# Patient Record
Sex: Male | Born: 1985 | Race: Black or African American | Hispanic: No | Smoking: Current every day smoker
Health system: Southern US, Community
[De-identification: ages and names within clinical notes are randomized; demographics above are authoritative.]

---

## 2009-10-12 ENCOUNTER — Emergency Department (HOSPITAL_COMMUNITY): Admission: EM | Admit: 2009-10-12 | Discharge: 2009-10-12 | Payer: Self-pay | Admitting: Family Medicine

## 2010-10-28 LAB — RPR: RPR Ser Ql: NONREACTIVE

## 2010-10-28 LAB — POCT RAPID STREP A (OFFICE): Streptococcus, Group A Screen (Direct): NEGATIVE

## 2010-10-28 LAB — GC/CHLAMYDIA PROBE AMP, GENITAL
Chlamydia, DNA Probe: POSITIVE — AB
GC Probe Amp, Genital: POSITIVE — AB

## 2012-12-09 ENCOUNTER — Emergency Department (HOSPITAL_COMMUNITY)
Admission: EM | Admit: 2012-12-09 | Discharge: 2012-12-09 | Disposition: A | Discharge status: Home / Self Care | Payer: Self-pay | Attending: Emergency Medicine | Admitting: Emergency Medicine

## 2012-12-09 ENCOUNTER — Encounter (HOSPITAL_COMMUNITY): Payer: Self-pay | Admitting: Emergency Medicine

## 2012-12-09 DIAGNOSIS — Z79899 Other long term (current) drug therapy: Secondary | ICD-10-CM | POA: Insufficient documentation

## 2012-12-09 DIAGNOSIS — N342 Other urethritis: Secondary | ICD-10-CM | POA: Insufficient documentation

## 2012-12-09 DIAGNOSIS — F17200 Nicotine dependence, unspecified, uncomplicated: Secondary | ICD-10-CM | POA: Insufficient documentation

## 2012-12-09 LAB — URINALYSIS, ROUTINE W REFLEX MICROSCOPIC
Bilirubin Urine: NEGATIVE
Ketones, ur: NEGATIVE mg/dL
Nitrite: NEGATIVE
Protein, ur: NEGATIVE mg/dL
pH: 6 (ref 5.0–8.0)

## 2012-12-09 LAB — URINE MICROSCOPIC-ADD ON

## 2012-12-09 MED ORDER — LIDOCAINE HCL 1 % IJ SOLN
INTRAMUSCULAR | Status: AC
  Administered 2012-12-09: 0.9 mL
  Filled 2012-12-09: qty 20

## 2012-12-09 MED ORDER — AZITHROMYCIN 250 MG PO TABS
1000.0000 mg | ORAL_TABLET | Freq: Every day | ORAL | Status: DC
  Administered 2012-12-09: 1000 mg via ORAL
  Filled 2012-12-09: qty 4

## 2012-12-09 MED ORDER — CEFTRIAXONE SODIUM 250 MG IJ SOLR
250.0000 mg | Freq: Once | INTRAMUSCULAR | Status: AC
Start: 2012-12-09 — End: 2012-12-09
  Administered 2012-12-09: 250 mg via INTRAMUSCULAR
  Filled 2012-12-09: qty 250
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
## 2012-12-09 NOTE — ED Provider Notes (Signed)
Medical screening examination/treatment/procedure(s) were performed by non-physician practitioner and as supervising physician I was immediately available for consultation/collaboration.    Toy Eisemann R Dominique Calvey, MD 12/09/12 2255 

## 2012-12-09 NOTE — ED Notes (Signed)
Pt reports 3/10 penile pain. Pt states he recently used the bathroom and used a dirty toilet and is concerned because he has noticed some pain and tingling in his penis. Pt reports that he experiences discomfort while urinating. Pt denies any penile discharge. Pt states no new sexual partners.

## 2012-12-11 LAB — URINE CULTURE

## 2012-12-12 ENCOUNTER — Telehealth (HOSPITAL_COMMUNITY): Payer: Self-pay | Admitting: Emergency Medicine

## 2012-12-12 NOTE — ED Notes (Signed)
Patient has +Gonorrhea. °

## 2012-12-12 NOTE — ED Notes (Signed)
+  Gonorrhea. Patient treated with Rocephin. [employer] faxed. 

## 2012-12-13 ENCOUNTER — Telehealth (HOSPITAL_COMMUNITY): Payer: Self-pay | Admitting: Emergency Medicine

## 2017-05-18 ENCOUNTER — Emergency Department
Admission: EM | Admit: 2017-05-18 | Discharge: 2017-05-18 | Disposition: A | Discharge status: Home / Self Care | Payer: Self-pay | Attending: Emergency Medicine | Admitting: Emergency Medicine

## 2017-05-18 DIAGNOSIS — Y999 Unspecified external cause status: Secondary | ICD-10-CM | POA: Insufficient documentation

## 2017-05-18 DIAGNOSIS — W5501XA Bitten by cat, initial encounter: Secondary | ICD-10-CM | POA: Insufficient documentation

## 2017-05-18 DIAGNOSIS — Z23 Encounter for immunization: Secondary | ICD-10-CM | POA: Insufficient documentation

## 2017-05-18 DIAGNOSIS — D75A Glucose-6-phosphate dehydrogenase (G6PD) deficiency without anemia: Secondary | ICD-10-CM

## 2017-05-18 DIAGNOSIS — Y929 Unspecified place or not applicable: Secondary | ICD-10-CM | POA: Insufficient documentation

## 2017-05-18 DIAGNOSIS — Y939 Activity, unspecified: Secondary | ICD-10-CM | POA: Insufficient documentation

## 2017-05-18 DIAGNOSIS — F17210 Nicotine dependence, cigarettes, uncomplicated: Secondary | ICD-10-CM | POA: Insufficient documentation

## 2017-05-18 DIAGNOSIS — S61452A Open bite of left hand, initial encounter: Secondary | ICD-10-CM | POA: Insufficient documentation

## 2017-05-18 MED ORDER — AMOXICILLIN-POT CLAVULANATE 500-125 MG PO TABS: 1.0000 | ORAL_TABLET | Freq: Three times a day (TID) | ORAL | 0 refills | Status: DC

## 2017-05-18 MED ORDER — HYDROCODONE-ACETAMINOPHEN 5-325 MG PO TABS
1.0000 | ORAL_TABLET | Freq: Once | ORAL | Status: AC
Start: 2017-05-18 — End: 2017-05-18
  Administered 2017-05-18: 1 via ORAL
  Filled 2017-05-18: qty 1

## 2017-05-18 MED ORDER — AMOXICILLIN-POT CLAVULANATE 875-125 MG PO TABS
1.0000 | ORAL_TABLET | Freq: Once | ORAL | Status: AC
  Administered 2017-05-18: 1 via ORAL
  Filled 2017-05-18: qty 1

## 2017-05-18 MED ORDER — HYDROCODONE-ACETAMINOPHEN 5-325 MG PO TABS: 1.0000 | ORAL_TABLET | Freq: Four times a day (QID) | ORAL | 0 refills | Status: DC | PRN

## 2017-05-18 MED ORDER — TETANUS-DIPHTH-ACELL PERTUSSIS 5-2.5-18.5 LF-MCG/0.5 IM SUSP
0.5000 mL | Freq: Once | INTRAMUSCULAR | Status: AC
  Administered 2017-05-18: 0.5 mL via INTRAMUSCULAR
  Filled 2017-05-18: qty 0.5

## 2017-05-18 MED ORDER — AMOXICILLIN-POT CLAVULANATE 875-125 MG PO TABS: 1.0000 | ORAL_TABLET | Freq: Two times a day (BID) | ORAL | 0 refills | Status: AC

## 2017-05-18 NOTE — Discharge Instructions (Signed)
Begin taking Augmentin this evening and continue for 10 days. Clean the area twice a day with mild soap and water. Follow-up with [facility] clinic acute care or return to the emergency room if any severe worsening of your symptoms. Take pain medication only as directed and do not take while driving.

## 2017-05-18 NOTE — ED Triage Notes (Signed)
Stray cat bite to left hand and wrist area last night, swelling and pain.

## 2017-05-18 NOTE — ED Notes (Signed)
See triage note.  Pt was bit by a stray cat yesterday and states that pain has increased and so has swelling in area.  Pt reports barely being able to move L thumb due to swelling.

## 2017-05-18 NOTE — ED Notes (Signed)
Pt discharged to home.  Family member driving.  Discharge instructions reviewed.  Verbalized understanding.  No questions or concerns at this time.  Teach back verified.  Pt in NAD.  No items left in ED.   

## 2017-05-18 NOTE — ED Provider Notes (Signed)
[facility] Emergency Department Provider Note  ____________________________________________   First MD Initiated Contact with Patient 05/18/17 1325     (approximate)  I have reviewed the triage vital signs and the nursing notes.   HISTORY  Chief Complaint Animal Bite   HPI Carlos Collier is a 31 y.o. male is here with complaint of pain to his left hand. Patient was bitten by a cat that was rescued by another person. He states that during the night it began getting warm and swollen. He is unsure of the last tetanus booster. Patient's friend is in the room and states that they have the cat at her home. She states that the cat did not contact differently and remained in the home during the night. Patient's friend states that the cat actually slept with her last night and she had no problems. Animal control is to be called. patient rates his pain as 10 over 10.   History reviewed. No pertinent past medical history.  Patient Active Problem List   Diagnosis Date Noted  . G6PD deficiency (HCC) 05/18/2017    History reviewed. No pertinent surgical history.  Prior to Admission medications   Medication Sig Start Date End Date Taking? Authorizing Provider  amoxicillin-clavulanate (AUGMENTIN) 875-125 MG tablet Take 1 tablet by mouth 2 (two) times daily. 05/18/17 05/25/17  Tommi Rumps, PA-C  cholecalciferol (VITAMIN D) 1000 UNITS tablet Take 1,000 Units by mouth daily.    [provider]  ferrous sulfate 325 (65 FE) MG tablet Take 325 mg by mouth daily with breakfast.    [provider]  HYDROcodone-acetaminophen (NORCO/VICODIN) 5-325 MG tablet Take 1 tablet by mouth every 6 (six) hours as needed for moderate pain. 05/18/17   Tommi Rumps, PA-C  vitamin C (ASCORBIC ACID) 500 MG tablet Take 500 mg by mouth daily.    [provider]    Allergies Aspirin and Glucose  No family history on file.  Social History Social History    Substance Use Topics  . Smoking status: Current Every Day Smoker    Packs/day: 0.25    Years: 5.00    Types: Cigarettes  . Smokeless tobacco: Never Used  . Alcohol use 3.0 oz/week    5 Cans of beer per week    Review of Systems Constitutional: No fever/chills Cardiovascular: Denies chest pain. Respiratory: Denies shortness of breath. Musculoskeletal: positive left hand pain. Skin: positive puncture wound and erythema. Neurological: Negative for  focal weakness or numbness. ____________________________________________   PHYSICAL EXAM:  VITAL SIGNS: ED Triage Vitals  Enc Vitals Group     BP 05/18/17 1155 138/89     Pulse Rate 05/18/17 1155 90     Resp 05/18/17 1155 18     Temp 05/18/17 1155 99 F (37.2 C)     Temp Source 05/18/17 1155 Oral     SpO2 05/18/17 1155 98 %     Weight 05/18/17 1155 162 lb (73.5 kg)     Height 05/18/17 1155  (1.727 m)     Head Circumference --      Peak Flow --      Pain Score 05/18/17 1154 10     Pain Loc --      Pain Edu? --      Excl. in GC? --    Constitutional: Alert and oriented. Well appearing and in no acute distress. Eyes: Conjunctivae are normal. P Head: Atraumatic. Neck: No stridor.   Cardiovascular: Normal rate, regular rhythm. Grossly normal  heart sounds.  Good peripheral circulation. Respiratory: Normal respiratory effort.  No retractions. Lungs CTAB. Musculoskeletal: on examination of the left hand there is a single puncture wound on the hyper thenar eminence with soft tissue swelling. There is no drainage from the puncture site. Area is erythematous and warm to touch. Patient at this time is able to flex and extend his left thumb. Capillary refill is less than 3 seconds. Neurologic:  Normal speech and language. No gross focal neurologic deficits are appreciated. No gait instability. Skin:  Skin is warm, dry.  As noted above. Psychiatric: Mood and affect are normal. Speech and behavior are  normal.  ____________________________________________   LABS (all labs ordered are listed, but only abnormal results are displayed)  Labs Reviewed - No data to display   PROCEDURES  Procedure(s) performed: None  Procedures  Critical Care performed: No  ____________________________________________   INITIAL IMPRESSION / ASSESSMENT AND PLAN / ED COURSE   Discussed Bite with patient and friend present in the room. Patient was started on Augmentin 875 while in the emergency department.he is also given a tetanus booster. Patient is continue taking Augmentin 875 twice a day for 10 days. Norco as needed for pain. He is to clean the area twice daily with mild soap and water. He is to return to the emergency room if any worsening of his symptoms or inability to flex and extend his thumb. They are aware that animal control needs to evaluate the And they are willing to keep a cat in the home for 10 days. They are reassuring that Looks healthy and has maintained normal behavior since this isolated incident. ____________________________________________   FINAL CLINICAL IMPRESSION(S) / ED DIAGNOSES  Final diagnoses:  Cat bite of left hand, initial encounter      NEW MEDICATIONS STARTED DURING THIS VISIT:  Discharge Medication List as of 05/18/2017  2:23 PM    START taking these medications   Details  amoxicillin-clavulanate (AUGMENTIN) 500-125 MG tablet Take 1 tablet (500 mg total) by mouth 3 (three) times daily., Starting Mon 05/18/2017, Print    HYDROcodone-acetaminophen (NORCO/VICODIN) 5-325 MG tablet Take 1 tablet by mouth every 6 (six) hours as needed for moderate pain., Starting Mon 05/18/2017, Print         Note:  This document was prepared using Dragon voice recognition software and may include unintentional dictation errors.    Tommi Rumps, PA-C 05/18/17 Mable Paris, MD 05/20/17 [phone]

## 2017-05-18 NOTE — ED Notes (Signed)
No animal bite report has been made at this time.

## 2017-09-11 ENCOUNTER — Emergency Department
Admission: EM | Admit: 2017-09-11 | Discharge: 2017-09-11 | Disposition: A | Discharge status: Home / Self Care | Payer: Self-pay | Attending: Emergency Medicine | Admitting: Emergency Medicine

## 2017-09-11 ENCOUNTER — Other Ambulatory Visit: Payer: Self-pay

## 2017-09-11 ENCOUNTER — Encounter: Payer: Self-pay | Admitting: Emergency Medicine

## 2017-09-11 DIAGNOSIS — A084 Viral intestinal infection, unspecified: Secondary | ICD-10-CM | POA: Insufficient documentation

## 2017-09-11 DIAGNOSIS — Z79899 Other long term (current) drug therapy: Secondary | ICD-10-CM | POA: Insufficient documentation

## 2017-09-11 DIAGNOSIS — F17210 Nicotine dependence, cigarettes, uncomplicated: Secondary | ICD-10-CM | POA: Insufficient documentation

## 2017-09-11 MED ORDER — ONDANSETRON HCL 4 MG PO TABS: 4.0000 mg | ORAL_TABLET | Freq: Every day | ORAL | 0 refills | Status: AC | PRN

## 2017-09-11 NOTE — ED Notes (Signed)
See triage note  Presents with just nausea. no vomiting. No pain.  Needing a note to go back to work   States able to keep po fluids down

## 2017-09-11 NOTE — ED Triage Notes (Addendum)
Pt arrived via POV from home with reports of N/V and decreased appetite, fatigue and loss stool.  Sxs started about 2 days.  No vomiting today or diarrhea. Pt states his stools are becoming more food.  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  Pt states he is feeling better. Pt states he is not eating.  Pt is drinking water and tolerating it.  PT denies any pain.

## 2017-09-11 NOTE — ED Provider Notes (Signed)
[facility] Emergency Department Provider Note  ____________________________________________  Time seen: Approximately 3:00 PM  I have reviewed the triage vital signs and the nursing notes.   HISTORY  Chief Complaint Nausea and Work Note    HPI Carlos Collier is a 32 y.o. male that presents emergency department for work note after missing 2 days of work for nausea, vomiting, diarrhea.  Patient has not had any vomiting or diarrhea today.  He states that 2 days ago he was eating at a friend's house and ate something with funny white stuff on it.  Vomiting started shortly after.  He has been able to drink liquids without vomiting today.  He has not checked his temperature but has had chills.  He needs a note for work and would like another 2 days of rest.  No abdominal pain.  No sick contacts.   History reviewed. No pertinent past medical history.  Patient Active Problem List   Diagnosis Date Noted  . G6PD deficiency (HCC) 05/18/2017    History reviewed. No pertinent surgical history.  Prior to Admission medications   Medication Sig Start Date End Date Taking? Authorizing Provider  cholecalciferol (VITAMIN D) 1000 UNITS tablet Take 1,000 Units by mouth daily.    [provider]  ferrous sulfate 325 (65 FE) MG tablet Take 325 mg by mouth daily with breakfast.    [provider]  HYDROcodone-acetaminophen (NORCO/VICODIN) 5-325 MG tablet Take 1 tablet by mouth every 6 (six) hours as needed for moderate pain. 05/18/17   Tommi Rumps, PA-C  ondansetron (ZOFRAN) 4 MG tablet Take 1 tablet (4 mg total) by mouth daily as needed for nausea or vomiting. 09/11/17 09/11/18  Enid Derry, PA-C  vitamin C (ASCORBIC ACID) 500 MG tablet Take 500 mg by mouth daily.    [provider]    Allergies Aspirin; Other; and Shellfish allergy  No family history on file.  Social History Social History   Tobacco Use  . Smoking status: Current Every Day  Smoker    Packs/day: 0.25    Years: 5.00    Pack years: 1.25    Types: Cigarettes  . Smokeless tobacco: Never Used  Substance Use Topics  . Alcohol use: Yes    Alcohol/week: 3.0 oz    Types: 5 Cans of beer per week  . Drug use: Yes    Types: Cocaine     Review of Systems   Cardiovascular: No chest pain. Respiratory: No SOB. Gastrointestinal: No abdominal pain.   Musculoskeletal: Negative for musculoskeletal pain. Skin: Negative for rash, abrasions, lacerations, ecchymosis. Neurological: Negative for headaches   ____________________________________________   PHYSICAL EXAM:  VITAL SIGNS: ED Triage Vitals  Enc Vitals Group     BP 09/11/17 1341 133/90     Pulse Rate 09/11/17 1341 79     Resp 09/11/17 1341 18     Temp 09/11/17 1341 98.9 F (37.2 C)     Temp Source 09/11/17 1341 Oral     SpO2 09/11/17 1341 100 %     Weight 09/11/17 1341 166 lb (75.3 kg)     Height 09/11/17 1341 5\' 8"  (1.727 m)     Head Circumference --      Peak Flow --      Pain Score 09/11/17 1345 0     Pain Loc --      Pain Edu? --      Excl. in GC? --      Constitutional: Alert and oriented. Well appearing  and in no acute distress. Eyes: Conjunctivae are normal. PERRL. EOMI. Head: Atraumatic. ENT:      Ears:      Nose: No congestion/rhinnorhea.      Mouth/Throat: Mucous membranes are moist.  Neck: No stridor.  Cardiovascular: Normal rate, regular rhythm.  Good peripheral circulation. Respiratory: Normal respiratory effort without tachypnea or retractions. Lungs CTAB. Good air entry to the bases with no decreased or absent breath sounds. Gastrointestinal: Bowel sounds 4 quadrants. Soft and nontender to palpation. No guarding or rigidity. No palpable masses. No distention. Musculoskeletal: Full range of motion to all extremities. No gross deformities appreciated. Neurologic:  Normal speech and language. No gross focal neurologic deficits are appreciated.  Skin:  Skin is warm, dry and  intact. No rash noted.   ____________________________________________   LABS (all labs ordered are listed, but only abnormal results are displayed)  Labs Reviewed - No data to display ____________________________________________  EKG   ____________________________________________  RADIOLOGY  No results found.  ____________________________________________    PROCEDURES  Procedure(s) performed:    Procedures    Medications - No data to display   ____________________________________________   INITIAL IMPRESSION / ASSESSMENT AND PLAN / ED COURSE  Pertinent labs & imaging results that were available during my care of the patient were reviewed by me and considered in my medical decision making (see chart for details).  Review of the [place] CSRS was performed in accordance of the [facility] prior to dispensing any controlled drugs.   Patient's diagnosis is consistent with viral gastroenteritis.  Vital signs and exam are reassuring.  Patient is starting to feel better.  Work note was provided.  Patient will be discharged home with prescriptions for Zofran. Patient is to follow up with PCP as directed. Patient is given ED precautions to return to the ED for any worsening or new symptoms.   ____________________________________________  FINAL CLINICAL IMPRESSION(S) / ED DIAGNOSES  Final diagnoses:  Viral gastroenteritis      NEW MEDICATIONS STARTED DURING THIS VISIT:  ED Discharge Orders        Ordered    ondansetron (ZOFRAN) 4 MG tablet  Daily PRN     09/11/17 1506          This chart was dictated using voice recognition software/Dragon. Despite best efforts to proofread, errors can occur which can change the meaning. Any change was purely unintentional.    Enid DerryWagner, Kadi Hession, PA-C 09/11/17 1537    Governor RooksLord, Rebecca, MD 09/12/17 [id number]

## 2019-01-16 ENCOUNTER — Emergency Department
Admission: EM | Admit: 2019-01-16 | Discharge: 2019-01-16 | Disposition: A | Discharge status: Home / Self Care | Payer: Self-pay | Attending: Emergency Medicine | Admitting: Emergency Medicine

## 2019-01-16 ENCOUNTER — Other Ambulatory Visit: Payer: Self-pay

## 2019-01-16 DIAGNOSIS — Z20828 Contact with and (suspected) exposure to other viral communicable diseases: Secondary | ICD-10-CM | POA: Insufficient documentation

## 2019-01-16 DIAGNOSIS — F17210 Nicotine dependence, cigarettes, uncomplicated: Secondary | ICD-10-CM | POA: Insufficient documentation

## 2019-01-16 DIAGNOSIS — K0889 Other specified disorders of teeth and supporting structures: Secondary | ICD-10-CM | POA: Insufficient documentation

## 2019-01-16 DIAGNOSIS — R519 Headache, unspecified: Secondary | ICD-10-CM

## 2019-01-16 DIAGNOSIS — R51 Headache: Secondary | ICD-10-CM | POA: Insufficient documentation

## 2019-01-16 LAB — CBC
HCT: 44.6 % (ref 39.0–52.0)
Hemoglobin: 15.5 g/dL (ref 13.0–17.0)
MCH: 34.4 pg — ABNORMAL HIGH (ref 26.0–34.0)
MCHC: 34.8 g/dL (ref 30.0–36.0)
MCV: 99.1 fL (ref 80.0–100.0)
Platelets: 259 10*3/uL (ref 150–400)
RBC: 4.5 MIL/uL (ref 4.22–5.81)
RDW: 13.4 % (ref 11.5–15.5)
WBC: 6.8 10*3/uL (ref 4.0–10.5)
nRBC: 0 % (ref 0.0–0.2)

## 2019-01-16 LAB — COMPREHENSIVE METABOLIC PANEL
ALT: 32 U/L (ref 0–44)
AST: 33 U/L (ref 15–41)
Albumin: 4.8 g/dL (ref 3.5–5.0)
Alkaline Phosphatase: 73 U/L (ref 38–126)
Anion gap: 13 (ref 5–15)
BUN: 13 mg/dL (ref 6–20)
CO2: 28 mmol/L (ref 22–32)
Calcium: 9.7 mg/dL (ref 8.9–10.3)
Chloride: 100 mmol/L (ref 98–111)
Creatinine, Ser: 0.73 mg/dL (ref 0.61–1.24)
GFR calc Af Amer: >60 mL/min (ref >60)
GFR calc non Af Amer: >60 mL/min (ref >60)
Glucose, Bld: 81 mg/dL (ref 70–99)
Potassium: 3.8 mmol/L (ref 3.5–5.1)
Sodium: 141 mmol/L (ref 135–145)
Total Bilirubin: 1.6 mg/dL — ABNORMAL HIGH (ref 0.3–1.2)
Total Protein: 8.2 g/dL — ABNORMAL HIGH (ref 6.5–8.1)

## 2019-01-16 MED ORDER — AMOXICILLIN 500 MG PO CAPS: 500.0000 mg | ORAL_CAPSULE | Freq: Three times a day (TID) | ORAL | 0 refills | Status: DC

## 2019-01-16 MED ORDER — SODIUM CHLORIDE 0.9 % IV BOLUS: 1000.0000 mL | Freq: Once | INTRAVENOUS | Status: DC

## 2019-01-16 MED ORDER — ACETAMINOPHEN 325 MG PO TABS
650.0000 mg | ORAL_TABLET | Freq: Once | ORAL | Status: AC
  Administered 2019-01-16: 650 mg via ORAL
  Filled 2019-01-16: qty 2

## 2019-01-16 MED ORDER — BUTALBITAL-APAP-CAFFEINE 50-325-40 MG PO TABS: 1.0000 | ORAL_TABLET | Freq: Three times a day (TID) | ORAL | 0 refills | Status: AC | PRN

## 2019-01-16 MED ORDER — KETOROLAC TROMETHAMINE 30 MG/ML IJ SOLN
15.0000 mg | Freq: Once | INTRAMUSCULAR | Status: AC
  Administered 2019-01-16: 15 mg via INTRAVENOUS
  Filled 2019-01-16: qty 1

## 2019-01-16 MED ORDER — BUTALBITAL-APAP-CAFFEINE 50-325-40 MG PO TABS: 1.0000 | ORAL_TABLET | Freq: Three times a day (TID) | ORAL | 0 refills | Status: DC | PRN
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
## 2019-01-16 NOTE — ED Notes (Signed)
Computer not working at time of [employer]. Pt verbalizes discharge instructions and medications.

## 2019-01-16 NOTE — ED Provider Notes (Signed)
-----------------------------------------   9:02 PM on 01/16/2019 -----------------------------------------  Patient's work-up is so far negative.  Corona virus send out has not resulted yet.  Patient will follow-up on this at home.  I discussed isolation/quarantine measures.  I also discussed supportive care.  We will write for an antibiotic to cover for possible dental infection as well as Fioricet for his headache although improved after medications.  Patient agreeable to plan of care.   Harvest Dark, MD 01/16/19 2103
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
## 2019-01-16 NOTE — Discharge Instructions (Signed)
Please begin amoxicillin for dental infection.  You can take Fioricet for headache.  Please be sure to drink plenty of fluids.  Please make an appointment with primary care for reevaluation.  OPTIONS FOR DENTAL FOLLOW UP CARE  [place] Department of Health and Human Services - Local Safety Net Dental Clinics [email or web address]://www.ncdhhs.gov/dph/oralhealth/services/safetynetclinics.htm   [facility] [phone])  [facility] [phone])  [facility] [phone])  [facility] [phone])  [facility] [phone]) This clinic caters to the indigent population and is on a lottery system. Location: [employer] [address], [address], [place] [facility] Hours: Wednesdays from 6pm - 9pm, patients seen by a lottery system. For dates, call or go to [email or web address] Services: Cleanings, fillings and simple extractions. Payment Options: DENTAL WORK IS FREE OF CHARGE. Bring proof of income or support. Best way to get seen: Arrive at 5:15 pm - this is a lottery, NOT first come/first serve, so arriving earlier will not increase your chances of being seen.     [facility] [phone] Select option 1 for emergencies   Location: [facility], [address], [address], [place] [facility] Hours: No walk-ins accepted - call the day before to schedule an appointment. Check in times are 9:30 am and 1:30 pm. Services: Simple extractions, temporary fillings, pulpectomy/pulp debridement, uncomplicated abscess drainage. Payment Options: PAYMENT IS DUE AT THE TIME OF SERVICE.  Fee is usually $100-200, additional surgical procedures (e.g. abscess drainage) may be extra. Cash, checks, Visa/MasterCard accepted.  Can file Medicaid if patient is covered for dental - patient should call case worker to check. No discount for [facility]  patients. Best way to get seen: MUST call the day before and get onto the schedule. Can usually be seen the next 1-2 days. No walk-ins accepted.     [facility] [phone]   Location: [facility], [address], [place] [facility] Hours: M, W, Th, F 8am or 1:30pm, Tues 9a or 1:30 - first come/first served. Services: Simple extractions, temporary fillings, uncomplicated abscess drainage.  You do not need to be an [place] resident. Payment Options: PAYMENT IS DUE AT THE TIME OF SERVICE. Dental insurance, otherwise sliding scale - bring proof of income or support. Depending on income and treatment needed, cost is usually $50-200. Best way to get seen: Arrive early as it is first come/first served.     [facility] [phone]   Location: [address] Clinic Hours: Mon-Thu 8a-5p Services: Most basic dental services including extractions and fillings. Payment Options: PAYMENT IS DUE AT THE TIME OF SERVICE. Sliding scale, up to 50% off - bring proof if income or support. Medicaid with dental option accepted. Best way to get seen: Call to schedule an appointment, can usually be seen within 2 weeks OR they will try to see walk-ins - show up at 8a or 2p (you may have to wait).     [facility] [phone] [place] RESIDENTS ONLY   Location: [facility], [address], [place], [place] [address] Clinic Hours: By appointment only. Monday - Thursday 8am-5pm, Friday 8am-12pm Services: Cleanings, fillings, extractions. Payment Options: PAYMENT IS DUE AT THE TIME OF SERVICE. Cash, Visa or MasterCard. Sliding scale - $30 minimum per service. Best way to get seen: Come in to office, complete packet and make an appointment - need proof of income or support monies for  each household member and proof of [facility] residence. Usually takes about a month to  get in.     [facility] [phone]   Location: [address] [place] Clinic Hours: Walk-in Urgent Care Dental Services are offered Monday-Friday mornings only. The numbers of emergencies accepted daily is limited to the number of providers available. Maximum 15 - Mondays, Wednesdays & Thursdays Maximum 10 - Tuesdays & Fridays Services: You do not need to be a [facility] resident to be seen for a dental emergency. Emergencies are defined as pain, swelling, abnormal bleeding, or dental trauma. Walkins will receive x-rays if needed. NOTE: Dental cleaning is not an emergency. Payment Options: PAYMENT IS DUE AT THE TIME OF SERVICE. Minimum co-pay is $40.00 for uninsured patients. Minimum co-pay is $3.00 for Medicaid with dental coverage. Dental Insurance is accepted and must be presented at time of visit. Medicare does not cover dental. Forms of payment: Cash, credit card, checks. Best way to get seen: If not previously registered with the clinic, walk-in dental registration begins at 7:15 am and is on a first come/first serve basis. If previously registered with the clinic, call to make an appointment.     [facility] [place] ONLY   Location: [address] [place], [place] Clinic Hours: Mon-Thu 10a-2p Services: Extractions only! Payment Options: FREE (donations accepted) - bring proof of income or support Best way to get seen: Call and schedule an appointment OR come at 8am on the 1st Monday of every month (except for holidays) when it is first come/first served.     [facility] [phone]   Location: [place], [place] Clinic Hours: Friday mornings Services, Payment Options, Best way to get seen: Call for info

## 2019-01-18 LAB — NOVEL CORONAVIRUS, NAA (HOSP ORDER, SEND-OUT TO REF LAB; TAT 18-24 HRS): SARS-CoV-2, NAA: NOT DETECTED

## 2019-01-19 ENCOUNTER — Telehealth: Payer: Self-pay | Admitting: Emergency Medicine

## 2019-01-19 NOTE — Telephone Encounter (Addendum)
Called patient to inform of covid 19 test result--negative.  Left message.\    pateint called me back and I gave him result.

## 2019-04-19 ENCOUNTER — Ambulatory Visit: Payer: Self-pay

## 2019-04-19 ENCOUNTER — Encounter: Payer: Self-pay | Admitting: Podiatry

## 2019-05-04 NOTE — Progress Notes (Signed)
This encounter was created in error - please disregard.

## 2019-05-28 ENCOUNTER — Emergency Department
Admission: EM | Admit: 2019-05-28 | Discharge: 2019-05-28 | Disposition: A | Discharge status: Home / Self Care | Payer: Self-pay | Attending: Emergency Medicine | Admitting: Emergency Medicine

## 2019-05-28 ENCOUNTER — Other Ambulatory Visit: Payer: Self-pay

## 2019-05-28 ENCOUNTER — Emergency Department: Payer: Self-pay

## 2019-05-28 DIAGNOSIS — S0990XA Unspecified injury of head, initial encounter: Secondary | ICD-10-CM

## 2019-05-28 DIAGNOSIS — F10920 Alcohol use, unspecified with intoxication, uncomplicated: Secondary | ICD-10-CM | POA: Insufficient documentation

## 2019-05-28 DIAGNOSIS — Y9389 Activity, other specified: Secondary | ICD-10-CM | POA: Insufficient documentation

## 2019-05-28 DIAGNOSIS — S0101XA Laceration without foreign body of scalp, initial encounter: Secondary | ICD-10-CM | POA: Insufficient documentation

## 2019-05-28 DIAGNOSIS — Y998 Other external cause status: Secondary | ICD-10-CM | POA: Insufficient documentation

## 2019-05-28 DIAGNOSIS — Y908 Blood alcohol level of 240 mg/100 ml or more: Secondary | ICD-10-CM | POA: Insufficient documentation

## 2019-05-28 DIAGNOSIS — F1410 Cocaine abuse, uncomplicated: Secondary | ICD-10-CM | POA: Insufficient documentation

## 2019-05-28 DIAGNOSIS — F17210 Nicotine dependence, cigarettes, uncomplicated: Secondary | ICD-10-CM | POA: Insufficient documentation

## 2019-05-28 DIAGNOSIS — Y929 Unspecified place or not applicable: Secondary | ICD-10-CM | POA: Insufficient documentation

## 2019-05-28 DIAGNOSIS — Z79899 Other long term (current) drug therapy: Secondary | ICD-10-CM | POA: Insufficient documentation

## 2019-05-28 DIAGNOSIS — R55 Syncope and collapse: Secondary | ICD-10-CM | POA: Insufficient documentation

## 2019-05-28 LAB — CBC
HCT: 44.6 % (ref 39.0–52.0)
Hemoglobin: 15.4 g/dL (ref 13.0–17.0)
MCH: 34.1 pg — ABNORMAL HIGH (ref 26.0–34.0)
MCHC: 34.5 g/dL (ref 30.0–36.0)
MCV: 98.7 fL (ref 80.0–100.0)
Platelets: 217 10*3/uL (ref 150–400)
RBC: 4.52 MIL/uL (ref 4.22–5.81)
RDW: 11.4 % — ABNORMAL LOW (ref 11.5–15.5)
WBC: 8 10*3/uL (ref 4.0–10.5)
nRBC: 0 % (ref 0.0–0.2)

## 2019-05-28 LAB — COMPREHENSIVE METABOLIC PANEL
ALT: 24 U/L (ref 0–44)
AST: 34 U/L (ref 15–41)
Albumin: 4.6 g/dL (ref 3.5–5.0)
Alkaline Phosphatase: 73 U/L (ref 38–126)
Anion gap: 18 — ABNORMAL HIGH (ref 5–15)
BUN: 12 mg/dL (ref 6–20)
CO2: 18 mmol/L — ABNORMAL LOW (ref 22–32)
Calcium: 8.8 mg/dL — ABNORMAL LOW (ref 8.9–10.3)
Chloride: 101 mmol/L (ref 98–111)
Creatinine, Ser: 0.95 mg/dL (ref 0.61–1.24)
GFR calc Af Amer: >60 mL/min (ref >60)
GFR calc non Af Amer: >60 mL/min (ref >60)
Glucose, Bld: 114 mg/dL — ABNORMAL HIGH (ref 70–99)
Potassium: 3.4 mmol/L — ABNORMAL LOW (ref 3.5–5.1)
Sodium: 137 mmol/L (ref 135–145)
Total Bilirubin: 1.1 mg/dL (ref 0.3–1.2)
Total Protein: 8.4 g/dL — ABNORMAL HIGH (ref 6.5–8.1)

## 2019-05-28 LAB — ETHANOL: Alcohol, Ethyl (B): 242 mg/dL — ABNORMAL HIGH (ref <10)

## 2019-05-28 MED ORDER — ACETAMINOPHEN 500 MG PO TABS
1000.0000 mg | ORAL_TABLET | Freq: Once | ORAL | Status: AC
  Administered 2019-05-28: 1000 mg via ORAL

## 2019-05-28 MED ORDER — ONDANSETRON HCL 4 MG/2ML IJ SOLN
4.0000 mg | Freq: Once | INTRAMUSCULAR | Status: AC
  Administered 2019-05-28: 4 mg via INTRAVENOUS
  Filled 2019-05-28: qty 2

## 2019-05-28 MED ORDER — ACETAMINOPHEN 500 MG PO TABS
ORAL_TABLET | ORAL | Status: AC
  Filled 2019-05-28: qty 2

## 2019-05-28 MED ORDER — SODIUM CHLORIDE 0.9 % IV BOLUS
1000.0000 mL | Freq: Once | INTRAVENOUS | Status: AC
  Administered 2019-05-28: 1000 mL via INTRAVENOUS

## 2019-05-28 MED ORDER — MORPHINE SULFATE (PF) 2 MG/ML IV SOLN
2.0000 mg | Freq: Once | INTRAVENOUS | Status: AC
  Administered 2019-05-28: 2 mg via INTRAVENOUS
  Filled 2019-05-28: qty 1

## 2019-05-28 NOTE — Discharge Instructions (Signed)
1.  Staple removal in 7 to 10 days. 2.  You may take Tylenol as needed for pain. 3.  Return to the ER for worsening symptoms, persistent vomiting, lethargy or other concerns.

## 2019-05-28 NOTE — ED Notes (Signed)
Patient transported to CT 
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
## 2019-05-28 NOTE — ED Notes (Signed)
Reviewed discharge instructions, follow-up care, and laceration care, and staple removal with patient. Patient verbalized understanding of all information reviewed. Patient stable, with no distress noted at this time.
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
## 2019-05-28 NOTE — ED Notes (Signed)
Patient sleeping

## 2019-05-28 NOTE — ED Notes (Signed)
MD, charge RN, Eyvonne Mechanic RN, and this RN at bedside for patient arrival.
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
## 2019-06-12 ENCOUNTER — Encounter: Payer: Self-pay | Admitting: Emergency Medicine

## 2019-06-12 ENCOUNTER — Emergency Department
Admission: EM | Admit: 2019-06-12 | Discharge: 2019-06-12 | Disposition: A | Discharge status: Home / Self Care | Payer: Self-pay | Attending: Student | Admitting: Student

## 2019-06-12 ENCOUNTER — Other Ambulatory Visit: Payer: Self-pay

## 2019-06-12 DIAGNOSIS — Z79899 Other long term (current) drug therapy: Secondary | ICD-10-CM | POA: Insufficient documentation

## 2019-06-12 DIAGNOSIS — Z4802 Encounter for removal of sutures: Secondary | ICD-10-CM | POA: Insufficient documentation

## 2019-06-12 DIAGNOSIS — F17210 Nicotine dependence, cigarettes, uncomplicated: Secondary | ICD-10-CM | POA: Insufficient documentation

## 2019-06-12 NOTE — ED Provider Notes (Signed)
[facility] Emergency Department Provider Note  ____________________________________________  Time seen: Approximately 2:14 PM  I have reviewed the triage vital signs and the nursing notes.   HISTORY  Chief Complaint Suture / Staple Removal    HPI Carlos Collier is a 33 y.o. male that presents to the  emergency department for staple removal.  Staples have been in place for 15 days.  No concerns.   History reviewed. No pertinent past medical history.  Patient Active Problem List   Diagnosis Date Noted  . G6PD deficiency 05/18/2017    History reviewed. No pertinent surgical history.  Prior to Admission medications   Medication Sig Start Date End Date Taking? Authorizing Provider  amoxicillin (AMOXIL) 500 MG capsule Take 1 capsule (500 mg total) by mouth 3 (three) times daily. 01/16/19   Minna Antis, MD  butalbital-acetaminophen-caffeine (FIORICET) [id number] MG tablet Take 1-2 tablets by mouth every 8 (eight) hours as needed for headache. 01/16/19 01/16/20  Minna Antis, MD  cholecalciferol (VITAMIN D) 1000 UNITS tablet Take 1,000 Units by mouth daily.    [provider]  ferrous sulfate 325 (65 FE) MG tablet Take 325 mg by mouth daily with breakfast.    [provider]  HYDROcodone-acetaminophen (NORCO/VICODIN) 5-325 MG tablet Take 1 tablet by mouth every 6 (six) hours as needed for moderate pain. 05/18/17   Tommi Rumps, PA-C  vitamin C (ASCORBIC ACID) 500 MG tablet Take 500 mg by mouth daily.    [provider]    Allergies Aspirin, Other, and Shellfish allergy  No family history on file.  Social History Social History   Tobacco Use  . Smoking status: Current Every Day Smoker    Packs/day: 0.25    Years: 5.00    Pack years: 1.25    Types: Cigarettes  . Smokeless tobacco: Never Used  Substance Use Topics  . Alcohol use: Yes    Alcohol/week: 5.0 standard drinks    Types: 5 Cans of beer per week  .  Drug use: Yes    Types: Cocaine     Review of Systems  Constitutional: No fever/chills Musculoskeletal: Negative for musculoskeletal pain. Skin: Negative for rash, ecchymosis.  Positive for stapled laceration. Neurological: Negative for headaches   ____________________________________________   PHYSICAL EXAM:  VITAL SIGNS: ED Triage Vitals  Enc Vitals Group     BP 06/12/19 1358 131/87     Pulse Rate 06/12/19 1357 62     Resp 06/12/19 1357 16     Temp 06/12/19 1357 99.1 F (37.3 C)     Temp Source 06/12/19 1357 Oral     SpO2 06/12/19 1357 97 %     Weight 06/12/19 1358 165 lb 5.5 oz (75 kg)     Height --      Head Circumference --      Peak Flow --      Pain Score 06/12/19 1357 0     Pain Loc --      Pain Edu? --      Excl. in [place]? --      Constitutional: Alert and oriented. Well appearing and in no acute distress. Eyes: Conjunctivae are normal. PERRL. EOMI. Head: Stapled laceration to posterior scalp with large overlying scab. ENT:      Ears:      Nose: No congestion/rhinnorhea.      Mouth/Throat: Mucous membranes are moist.  Neck: No stridor.  Cardiovascular: Normal rate, regular rhythm.  Good peripheral circulation. Respiratory: Normal respiratory effort without tachypnea  or retractions. Lungs CTAB. Good air entry to the bases with no decreased or absent breath sounds. Musculoskeletal: Full range of motion to all extremities. No gross deformities appreciated. Neurologic:  Normal speech and language. No gross focal neurologic deficits are appreciated.  Skin:  Skin is warm, dry and intact. No rash noted. Psychiatric: Mood and affect are normal. Speech and behavior are normal. Patient exhibits appropriate insight and judgement.   ____________________________________________   LABS (all labs ordered are listed, but only abnormal results are displayed)  Labs Reviewed - No data to  display ____________________________________________  EKG   ____________________________________________  RADIOLOGY   No results found.  ____________________________________________    PROCEDURES  Procedure(s) performed:    Procedures  SUTURE REMOVAL Performed by: Laban Emperor  Consent: Verbal consent obtained. Patient identity confirmed: provided demographic data Time out: Immediately prior to procedure a "time out" was called to verify the correct patient, procedure, equipment, support staff and site/side marked as required.  Location details: scalp  Wound Appearance: clean  Sutures/Staples Removed: 10  Facility: sutures placed in this facility Patient tolerance: Patient tolerated the procedure well with no immediate complications.    Medications - No data to display   ____________________________________________   INITIAL IMPRESSION / ASSESSMENT AND PLAN / ED COURSE  Pertinent labs & imaging results that were available during my care of the patient were reviewed by me and considered in my medical decision making (see chart for details).  Review of the [place] CSRS was performed in accordance of the [place] prior to dispensing any controlled drugs.     Patient presented to emergency department for staple removal.  Vital signs and exam are reassuring.  10 staples were removed in the emergency department.  Per previous ER note, 11 staples were placed.  I am unable to visualize 11 staples.  Patient will return to emergency department if he does visualize another staple after scab falls off.  Patient recalls being told there were only 10 staples.  Patient is to follow up with PCP as directed. Patient is given ED precautions to return to the ED for any worsening or new symptoms.   Carlos Collier was evaluated in Emergency Department on 06/12/2019 for the symptoms described in the history of present illness. He was evaluated in the context of the global COVID-19  pandemic, which necessitated consideration that the patient might be at risk for infection with the SARS-CoV-2 virus that causes COVID-19. Institutional protocols and algorithms that pertain to the evaluation of patients at risk for COVID-19 are in a state of rapid change based on information released by regulatory bodies including the CDC and federal and state organizations. These policies and algorithms were followed during the patient's care in the ED.  ____________________________________________  FINAL CLINICAL IMPRESSION(S) / ED DIAGNOSES  Final diagnoses:  Removal of staples      NEW MEDICATIONS STARTED DURING THIS VISIT:  ED Discharge Orders    None          This chart was dictated using voice recognition software/Dragon. Despite best efforts to proofread, errors can occur which can change the meaning. Any change was purely unintentional.    Laban Emperor, PA-C 06/12/19 1721    Lilia Pro., MD 06/13/19 [phone]

## 2019-06-12 NOTE — ED Notes (Signed)
See triage note for assessment  Pt here for suture/staple removal

## 2019-06-12 NOTE — ED Triage Notes (Signed)
Pt to ED via POV for suture and staple removal. Pt is in NAD and denies any complaints at this time.

## 2019-12-16 IMAGING — CT CT HEAD W/O CM
4 series · 16 of 47 positions shown, 18 images · non-contrast
Comparison: None.

CLINICAL DATA: 33-year-old male with head injury.

EXAM:
CT HEAD WITHOUT CONTRAST
CT CERVICAL SPINE WITHOUT CONTRAST
TECHNIQUE: Multidetector CT imaging of the head and cervical spine was
performed following the standard protocol without intravenous
contrast. Multiplanar CT image reconstructions of the cervical spine
were also generated.

[Series 2: head wo · axial · 0.45mm/px · z∈[-102,+3]mm · 7 of 29 slices shown, 9 images]
[im 4/29  brain]
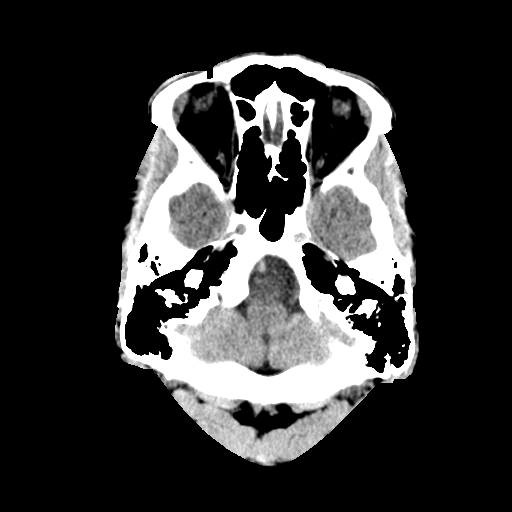
[im 4/29  bone]
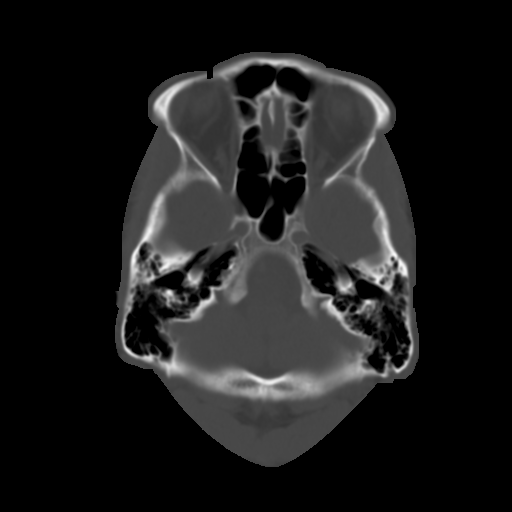
[im 8/29  brain]
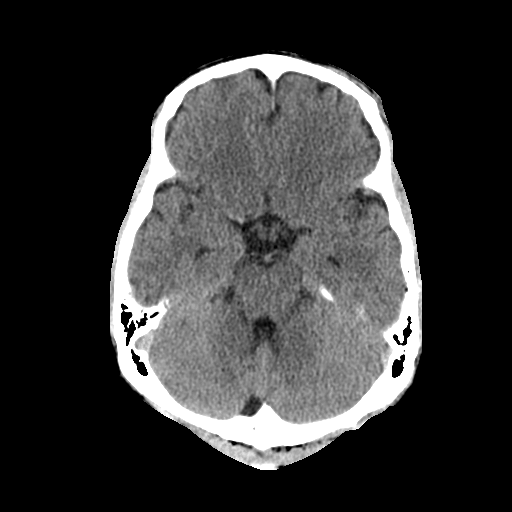
[im 11/29  brain]
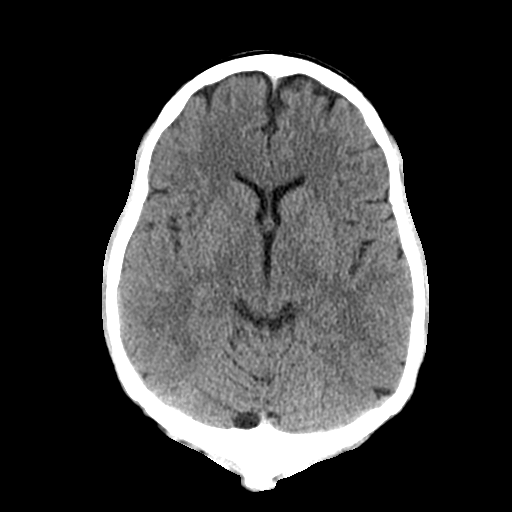
[im 15/29  brain]
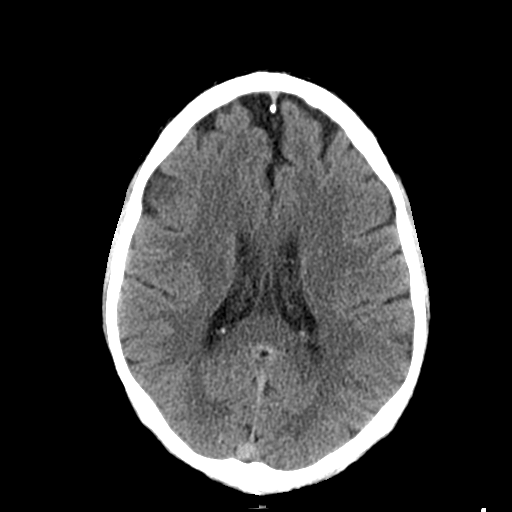
[im 18/29  brain]
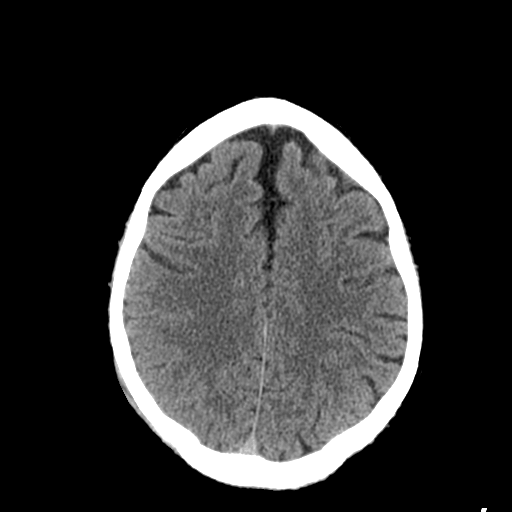
[im 18/29  bone]
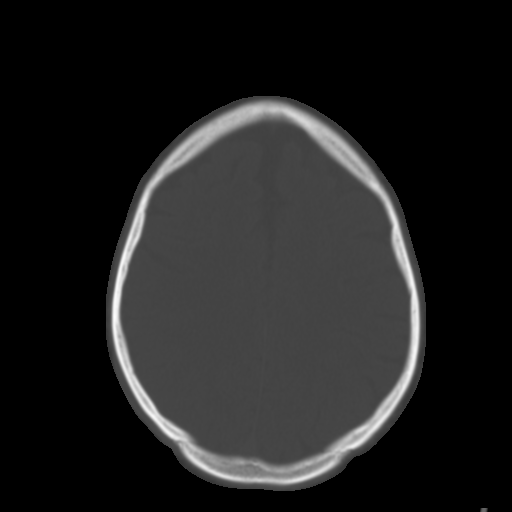
[im 22/29  brain]
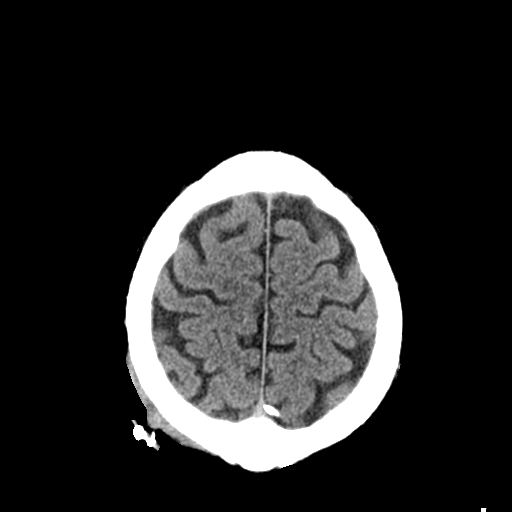
[im 25/29  brain]
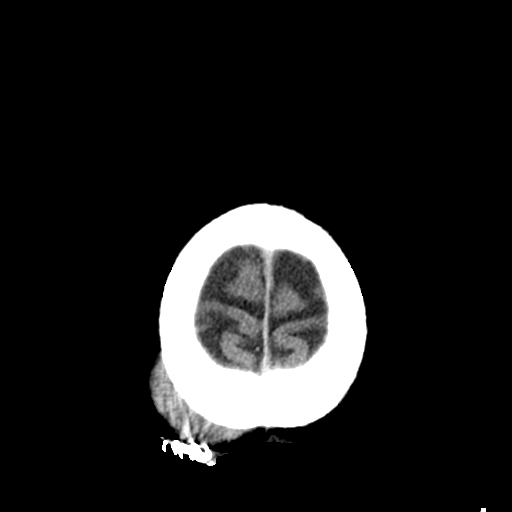

[Series 3: head bone · axial · 0.45mm/px · z∈[-104,-76]mm · 3 of 73 slices shown]
[im 8/73  bone]
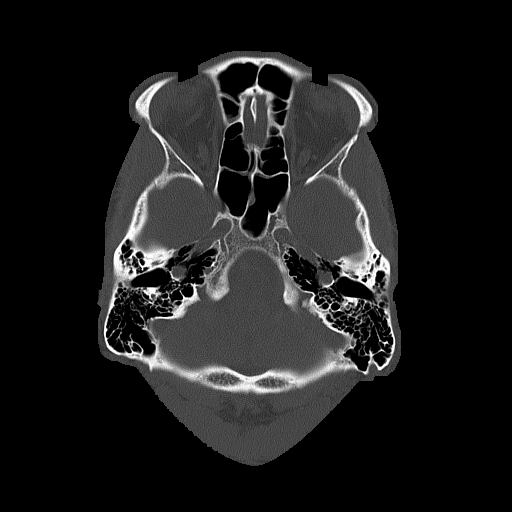
[im 15/73  bone]
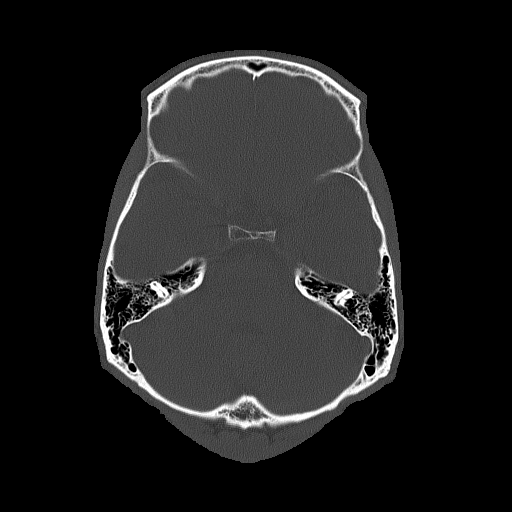
[im 22/73  bone]
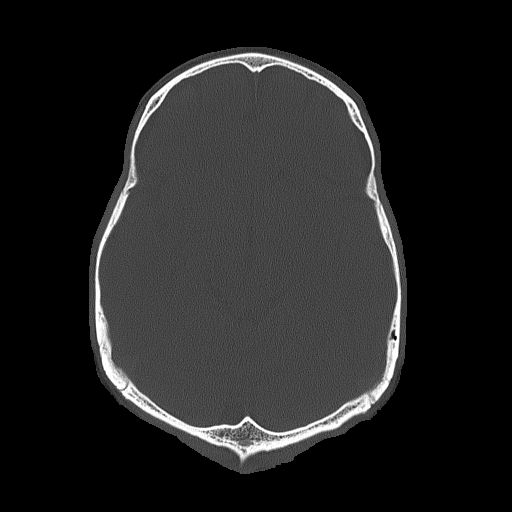

[Series 4: coronal soft tissue · coronal · 0.32mm/px · 3 of 66 slices shown]
[im 22/66  brain]
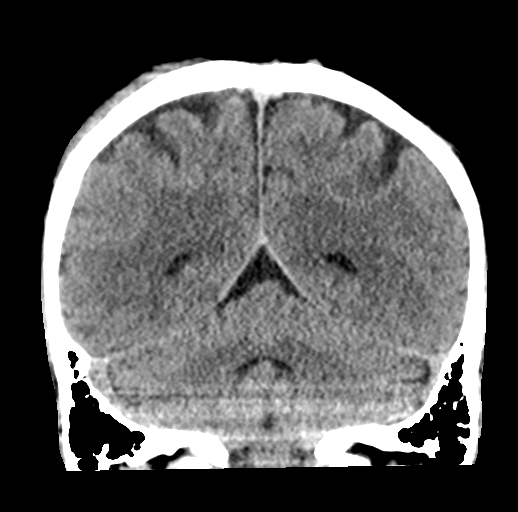
[im 29/66  brain]
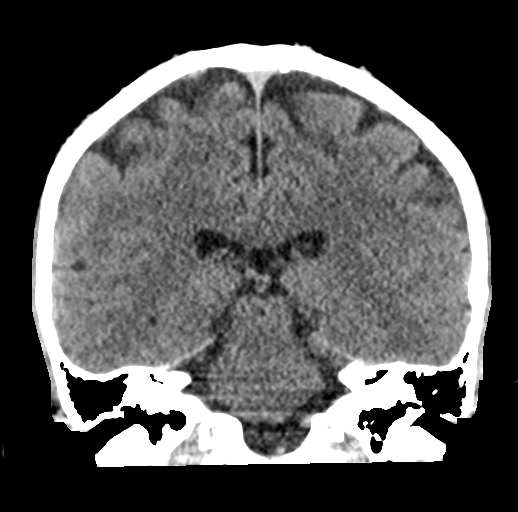
[im 37/66  brain]
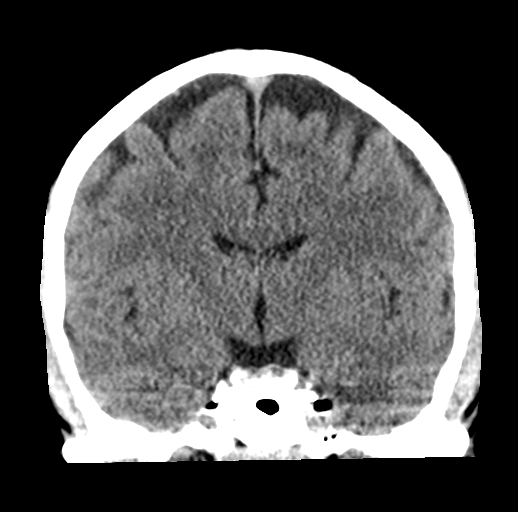

[Series 5: sagittal soft tissue · sagittal · 0.32mm/px · 3 of 52 slices shown]
[im 18/52  brain]
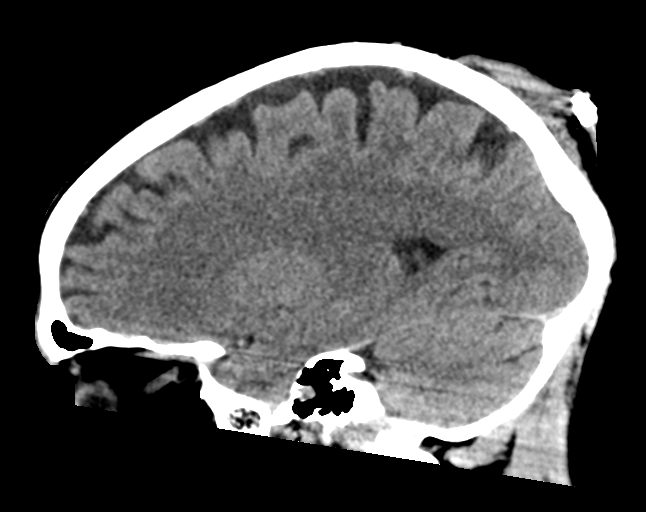
[im 26/52  brain]
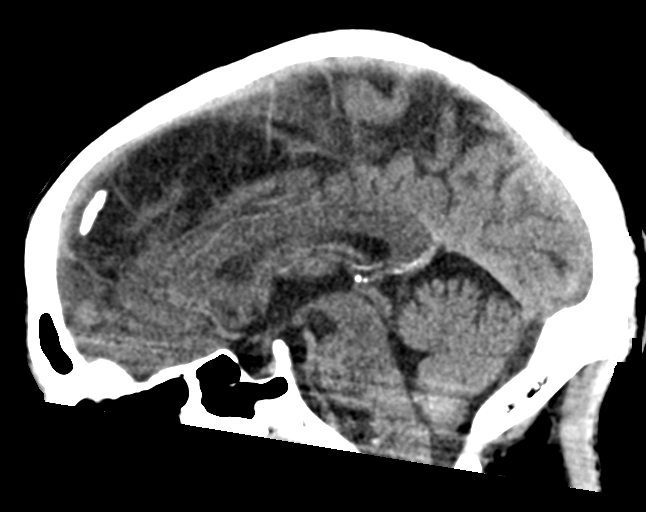
[im 35/52  brain]
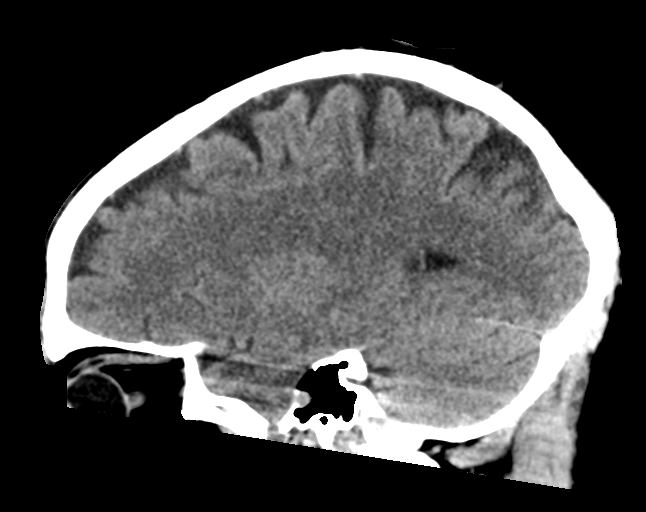

[16 of 47 positions shown; findings below may reference images not displayed]

FINDINGS: CT HEAD FINDINGS

Brain: The ventricles and sulci appropriate size for patient's age.
The gray-white matter discrimination is preserved. There is no acute
intracranial hemorrhage. No mass effect or midline shift. No
extra-axial fluid collection.

Vascular: No hyperdense vessel or unexpected calcification.

Skull: Normal. Negative for fracture or focal lesion.

Sinuses/Orbits: No acute finding.

Other: Right posterior parietal scalp hematoma and skin staples.

CT CERVICAL SPINE FINDINGS

Alignment: No acute subluxation. There is straightening of normal
cervical lordosis which may be positional or due to muscle spasm.

Skull base and vertebrae: No acute fracture. No primary bone lesion
or focal pathologic process.

Soft tissues and spinal canal: No prevertebral fluid or swelling. No
visible canal hematoma.

Disc levels:  No acute findings. No degenerative changes.

Upper chest: Negative.

Other: None
IMPRESSION: 1. No acute intracranial pathology.
2. No acute/traumatic cervical spine pathology.

## 2020-01-10 ENCOUNTER — Emergency Department
Admission: EM | Admit: 2020-01-10 | Discharge: 2020-01-10 | Disposition: A | Discharge status: Home / Self Care | Payer: Self-pay | Attending: Student | Admitting: Student

## 2020-01-10 ENCOUNTER — Other Ambulatory Visit: Payer: Self-pay

## 2020-01-10 DIAGNOSIS — Z79899 Other long term (current) drug therapy: Secondary | ICD-10-CM | POA: Insufficient documentation

## 2020-01-10 DIAGNOSIS — K0889 Other specified disorders of teeth and supporting structures: Secondary | ICD-10-CM | POA: Insufficient documentation

## 2020-01-10 DIAGNOSIS — F17210 Nicotine dependence, cigarettes, uncomplicated: Secondary | ICD-10-CM | POA: Insufficient documentation

## 2020-01-10 MED ORDER — KETOROLAC TROMETHAMINE 30 MG/ML IJ SOLN
30.0000 mg | Freq: Once | INTRAMUSCULAR | Status: AC
  Administered 2020-01-10: 30 mg via INTRAMUSCULAR
  Filled 2020-01-10: qty 1

## 2020-01-10 MED ORDER — LIDOCAINE VISCOUS HCL 2 % MT SOLN: 10.0000 mL | OROMUCOSAL | 0 refills | Status: DC | PRN

## 2020-01-10 MED ORDER — LIDOCAINE VISCOUS HCL 2 % MT SOLN
15.0000 mL | Freq: Once | OROMUCOSAL | Status: AC
  Administered 2020-01-10: 15 mL via OROMUCOSAL
  Filled 2020-01-10: qty 15

## 2020-01-10 MED ORDER — AMOXICILLIN 500 MG PO CAPS
500.0000 mg | ORAL_CAPSULE | Freq: Once | ORAL | Status: AC
  Administered 2020-01-10: 500 mg via ORAL
  Filled 2020-01-10: qty 1

## 2020-01-10 MED ORDER — AMOXICILLIN 500 MG PO CAPS: 500.0000 mg | ORAL_CAPSULE | Freq: Three times a day (TID) | ORAL | 0 refills | Status: DC

## 2020-01-10 MED ORDER — KETOROLAC TROMETHAMINE 10 MG PO TABS: 10.0000 mg | ORAL_TABLET | Freq: Four times a day (QID) | ORAL | 0 refills | Status: DC | PRN

## 2020-01-10 NOTE — Discharge Instructions (Signed)
OPTIONS FOR DENTAL FOLLOW UP CARE ° °[place] Department of Health and Human Services - Local Safety Net Dental Clinics °http://www.ncdhhs.gov/dph/oralhealth/services/safetynetclinics.htm °  °[facility] (336-562-3123) ° °[facility] [place] ([phone]) ° °[facility] ([phone] ext [phone]) ° °[facility] ([phone]) ° °[facility] ([phone]) °This clinic caters to the indigent population and is on a lottery system. °Location: °[facility], [address], [place] °Clinic Hours: °Wednesdays from 6pm - 9pm, patients seen by a lottery system. °For dates, call or go to [email or web address]/[facility]/patients/[email or web address] °Services: °Cleanings, fillings and simple extractions. °Payment Options: °DENTAL WORK IS FREE OF CHARGE. Bring proof of income or support. °Best way to get seen: °Arrive at 5:15 pm - this is a lottery, NOT first come/first serve, so arriving earlier will not increase your chances of being seen. °  °  °[facility] °[phone] °Select option 1 for emergencies °  °Location: °[facility], [address], [place] °Clinic Hours: °No walk-ins accepted - call the day before to schedule an appointment. °Check in times are 9:30 am and 1:30 pm. °Services: °Simple extractions, temporary fillings, pulpectomy/pulp debridement, uncomplicated abscess drainage. °Payment Options: °PAYMENT IS DUE AT THE TIME OF SERVICE.  Fee is usually $100-200, additional surgical procedures (e.g. abscess drainage) may be extra. °Cash, checks, Visa/MasterCard accepted.  Can file Medicaid if patient is covered for dental - patient should call case worker to check. °No discount for [facility] patients. °Best way to get seen: °MUST call the day before and get onto the schedule. Can usually be seen the next 1-2 days. No walk-ins accepted. °  °  °[facility] °[phone] °   °Location: °[facility], [address], [place] °Clinic Hours: °M, W, Th, F 8am or 1:30pm, Tues 9a or 1:30 - first come/first served. °Services: °Simple extractions, temporary fillings, uncomplicated abscess drainage.  You do not need to be an [place] resident. °Payment Options: °PAYMENT IS DUE AT THE TIME OF SERVICE. °Dental insurance, otherwise sliding scale - bring proof of income or support. °Depending on income and treatment needed, cost is usually $50-200. °Best way to get seen: °Arrive early as it is first come/first served. °  °  °[facility] °[phone] °  °Location: °[address] °Clinic Hours: °Mon-Thu 8a-5p °Services: °Most basic dental services including extractions and fillings. °Payment Options: °PAYMENT IS DUE AT THE TIME OF SERVICE. °Sliding scale, up to 50% off - bring proof if income or support. °Medicaid with dental option accepted. °Best way to get seen: °Call to schedule an appointment, can usually be seen within 2 weeks OR they will try to see walk-ins - show up at 8a or 2p (you may have to wait). °  °  °[facility] °[phone] °[place] RESIDENTS ONLY °  °Location: °[facility], [address], [place],  [address] °Clinic Hours: By appointment only. °Monday - Thursday 8am-5pm, Friday 8am-12pm °Services: Cleanings, fillings, extractions. °Payment Options: °PAYMENT IS DUE AT THE TIME OF SERVICE. °Cash, Visa or MasterCard. Sliding scale - $30 minimum per service. °Best way to get seen: °Come in to office, complete packet and make an appointment - need proof of income °or support monies for each household member and proof of [place] residence. °Usually takes about a month to get in. °  °  °[facility] °[phone] °  °Location: °[address]   [place] °Clinic Hours: Walk-in Urgent Care Dental Services are offered Monday-Friday  mornings only. °The numbers of emergencies accepted daily is limited to the number of °providers available. °Maximum 15 - Mondays, Wednesdays & Thursdays °Maximum 10 - Tuesdays & Fridays °Services: °You do not need to be a [place] resident to be seen for a dental emergency. °Emergencies are defined as pain, swelling, abnormal bleeding, or dental trauma. Walkins will receive x-rays if needed. °NOTE: Dental cleaning is not an emergency. °Payment Options: °PAYMENT IS DUE AT THE TIME OF SERVICE. °Minimum co-pay is $40.00 for uninsured patients. °Minimum co-pay is $3.00 for Medicaid with dental coverage. °Dental Insurance is accepted and must be presented at time of visit. °Medicare does not cover dental. °Forms of payment: Cash, credit card, checks. °Best way to get seen: °If not previously registered with the clinic, walk-in dental registration begins at 7:15 am and is on a first come/first serve basis. °If previously registered with the clinic, call to make an appointment. °  °  °[facility] °[phone] °[place] RESIDENTS ONLY °  °Location: °[address], [place], [place] °Clinic Hours: °Mon-Thu 10a-2p °Services: Extractions only! °Payment Options: °FREE (donations accepted) - bring proof of income or support °Best way to get seen: °Call and schedule an appointment OR come at 8am on the 1st Monday of every month (except for holidays) when it is first come/first served. °  °  °[facility] °[phone] °  °Location: °[address], [place] °Clinic Hours: °Friday mornings °Services, Payment Options, Best way to get seen: °Call for info °

## 2020-01-10 NOTE — ED Provider Notes (Signed)
[facility] Emergency Department Provider Note  ____________________________________________  Time seen: Approximately 9:40 PM  I have reviewed the triage vital signs and the nursing notes.   HISTORY  Chief Complaint Dental Pain    HPI Carlos Collier is a 34 y.o. male that presents to emergency department for evaluation of right bottom dental pain today.  Patient states that pain is making the whole right side of his face hurt.  It is giving him a right-sided headache.  Patient called his dentist today but they were unable to get him in for an appointment today.  No known fevers but patient has felt warm.   History reviewed. No pertinent past medical history.  Patient Active Problem List   Diagnosis Date Noted  . G6PD deficiency 05/18/2017    History reviewed. No pertinent surgical history.  Prior to Admission medications   Medication Sig Start Date End Date Taking? Authorizing Provider  amoxicillin (AMOXIL) 500 MG capsule Take 1 capsule (500 mg total) by mouth 3 (three) times daily. 01/10/20   Enid Derry, PA-C  butalbital-acetaminophen-caffeine (FIORICET) [phone] MG tablet Take 1-2 tablets by mouth every 8 (eight) hours as needed for headache. 01/16/19 01/16/20  Minna Antis, MD  cholecalciferol (VITAMIN D) 1000 UNITS tablet Take 1,000 Units by mouth daily.    [provider]  ferrous sulfate 325 (65 FE) MG tablet Take 325 mg by mouth daily with breakfast.    [provider]  HYDROcodone-acetaminophen (NORCO/VICODIN) 5-325 MG tablet Take 1 tablet by mouth every 6 (six) hours as needed for moderate pain. 05/18/17   Tommi Rumps, PA-C  ketorolac (TORADOL) 10 MG tablet Take 1 tablet (10 mg total) by mouth every 6 (six) hours as needed. 01/10/20   Enid Derry, PA-C  lidocaine (XYLOCAINE) 2 % solution Use as directed 10 mLs in the mouth or throat as needed. 01/10/20   Enid Derry, PA-C  vitamin C (ASCORBIC ACID) 500 MG tablet Take  500 mg by mouth daily.    [provider]    Allergies Aspirin, Other, and Shellfish allergy  No family history on file.  Social History Social History   Tobacco Use  . Smoking status: Current Every Day Smoker    Packs/day: 0.25    Years: 5.00    Pack years: 1.25    Types: Cigarettes  . Smokeless tobacco: Never Used  Substance Use Topics  . Alcohol use: Yes    Alcohol/week: 5.0 standard drinks    Types: 5 Cans of beer per week  . Drug use: Yes    Types: Cocaine     Review of Systems  Constitutional: No fever Cardiovascular: No chest pain. Respiratory: No SOB. Gastrointestinal: No abdominal pain.  No nausea, no vomiting.  Musculoskeletal: Negative for musculoskeletal pain. Skin: Negative for rash, abrasions, lacerations, ecchymosis. Neurological: Positive for headache.   ____________________________________________   PHYSICAL EXAM:  VITAL SIGNS: ED Triage Vitals  Enc Vitals Group     BP 01/10/20 2010 (!) 154/101     Pulse Rate 01/10/20 2010 (!) 57     Resp 01/10/20 2010 15     Temp 01/10/20 2010 98.4 F (36.9 C)     Temp Source 01/10/20 2010 Oral     SpO2 01/10/20 2010 99 %     Weight 01/10/20 2011 190 lb (86.2 kg)     Height 01/10/20 2011 5\' 8"  (1.727 m)     Head Circumference --      Peak Flow --  Pain Score 01/10/20 2015 10     Pain Loc --      Pain Edu? --      Excl. in [place]? --      Constitutional: Alert and oriented. Well appearing and in no acute distress. Eyes: Conjunctivae are normal. PERRL. EOMI. Head: Atraumatic. ENT:      Ears: Tympanic membranes are pearly.      Nose: No congestion/rhinnorhea.      Mouth/Throat: Mucous membranes are moist.  Large cavity and decay to right bottom molar.  No swelling.  Full range of motion of jaw. Neck: No stridor.   Cardiovascular: Normal rate, regular rhythm.  Good peripheral circulation. Respiratory: Normal respiratory effort without tachypnea or retractions. Lungs CTAB. Good air entry to  the bases with no decreased or absent breath sounds. Musculoskeletal: Full range of motion to all extremities. No gross deformities appreciated. Neurologic:  Normal speech and language. No gross focal neurologic deficits are appreciated.  Skin:  Skin is warm, dry and intact. No rash noted. Psychiatric: Mood and affect are normal. Speech and behavior are normal. Patient exhibits appropriate insight and judgement.   ____________________________________________   LABS (all labs ordered are listed, but only abnormal results are displayed)  Labs Reviewed - No data to display ____________________________________________  EKG   ____________________________________________  RADIOLOGY   No results found.  ____________________________________________    PROCEDURES  Procedure(s) performed:    Procedures    Medications  ketorolac (TORADOL) 30 MG/ML injection 30 mg (30 mg Intramuscular Given 01/10/20 2157)  lidocaine (XYLOCAINE) 2 % viscous mouth solution 15 mL (15 mLs Mouth/Throat Given 01/10/20 2157)  amoxicillin (AMOXIL) capsule 500 mg (500 mg Oral Given 01/10/20 2157)     ____________________________________________   INITIAL IMPRESSION / ASSESSMENT AND PLAN / ED COURSE  Pertinent labs & imaging results that were available during my care of the patient were reviewed by me and considered in my medical decision making (see chart for details).  Review of the [place] CSRS was performed in accordance of the [place] prior to dispensing any controlled drugs.     Patient's diagnosis is consistent with dental infection.  Vital signs and exam are reassuring.  Patient was given IM Toradol for pain.  He was given amoxicillin for possible infection.  Patient will be discharged home with prescriptions for amoxicillin, Toradol, viscous lidocaine. Patient is to follow up with dentist as directed.  Patient has a Pharmacist, community but dental resources were provided.  Patient is given ED precautions to return  to the ED for any worsening or new symptoms.   Carlos Collier was evaluated in Emergency Department on 01/10/2020 for the symptoms described in the history of present illness. He was evaluated in the context of the global COVID-19 pandemic, which necessitated consideration that the patient might be at risk for infection with the SARS-CoV-2 virus that causes COVID-19. Institutional protocols and algorithms that pertain to the evaluation of patients at risk for COVID-19 are in a state of rapid change based on information released by regulatory bodies including the CDC and federal and state organizations. These policies and algorithms were followed during the patient's care in the ED.  ____________________________________________  FINAL CLINICAL IMPRESSION(S) / ED DIAGNOSES  Final diagnoses:  Pain, dental      NEW MEDICATIONS STARTED DURING THIS VISIT:  ED Discharge Orders         Ordered    amoxicillin (AMOXIL) 500 MG capsule  3 times daily,   Status:  Discontinued  01/10/20 2239    lidocaine (XYLOCAINE) 2 % solution  As needed,   Status:  Discontinued     01/10/20 2239    ketorolac (TORADOL) 10 MG tablet  Every 6 hours PRN,   Status:  Discontinued     01/10/20 2239    amoxicillin (AMOXIL) 500 MG capsule  3 times daily     01/10/20 2249    ketorolac (TORADOL) 10 MG tablet  Every 6 hours PRN     01/10/20 2249    lidocaine (XYLOCAINE) 2 % solution  As needed     01/10/20 2249              This chart was dictated using voice recognition software/Dragon. Despite best efforts to proofread, errors can occur which can change the meaning. Any change was purely unintentional.    Enid Derry, PA-C 01/10/20 2251    Miguel Aschoff., MD 01/10/20 [phone]

## 2020-01-10 NOTE — ED Triage Notes (Signed)
Pt arrives to ED via POV from home with c/o lower and upper right-sided dental pain since 7am this AM. Pt reports he called his dentist and was told to come to the ED d/t no appointment available. Pt reports taking OTC Tylenol PTA without relief. Pt reports (+) nausea, denies diarrhea or fever. Pt is A&O, in NAD; RR even, regular, and unlabored.

## 2020-05-10 ENCOUNTER — Other Ambulatory Visit: Payer: Self-pay

## 2020-05-10 ENCOUNTER — Encounter: Payer: Self-pay | Admitting: *Deleted

## 2020-05-10 ENCOUNTER — Emergency Department
Admission: EM | Admit: 2020-05-10 | Discharge: 2020-05-10 | Disposition: A | Discharge status: Home / Self Care | Payer: Self-pay | Attending: Emergency Medicine | Admitting: Emergency Medicine

## 2020-05-10 DIAGNOSIS — K0889 Other specified disorders of teeth and supporting structures: Secondary | ICD-10-CM

## 2020-05-10 DIAGNOSIS — S025XXA Fracture of tooth (traumatic), initial encounter for closed fracture: Secondary | ICD-10-CM

## 2020-05-10 DIAGNOSIS — K029 Dental caries, unspecified: Secondary | ICD-10-CM | POA: Insufficient documentation

## 2020-05-10 DIAGNOSIS — F17210 Nicotine dependence, cigarettes, uncomplicated: Secondary | ICD-10-CM | POA: Insufficient documentation

## 2020-05-10 DIAGNOSIS — K0381 Cracked tooth: Secondary | ICD-10-CM | POA: Insufficient documentation

## 2020-05-10 DIAGNOSIS — K047 Periapical abscess without sinus: Secondary | ICD-10-CM | POA: Insufficient documentation

## 2020-05-10 MED ORDER — AMOXICILLIN-POT CLAVULANATE 875-125 MG PO TABS
1.0000 | ORAL_TABLET | Freq: Once | ORAL | Status: AC
  Administered 2020-05-10: 1 via ORAL
  Filled 2020-05-10: qty 1

## 2020-05-10 MED ORDER — AMOXICILLIN-POT CLAVULANATE 875-125 MG PO TABS: 1.0000 | ORAL_TABLET | Freq: Two times a day (BID) | ORAL | 0 refills | Status: AC

## 2020-05-10 MED ORDER — HYDROCODONE-ACETAMINOPHEN 5-325 MG PO TABS
1.0000 | ORAL_TABLET | ORAL | Status: AC
  Administered 2020-05-10: 1 via ORAL
  Filled 2020-05-10: qty 1

## 2020-05-10 MED ORDER — HYDROCODONE-ACETAMINOPHEN 5-325 MG PO TABS: 1.0000 | ORAL_TABLET | ORAL | 0 refills | Status: DC | PRN

## 2020-05-10 NOTE — Discharge Instructions (Signed)
Please take antibiotics as prescribed.  Take pain medication as prescribed.  You may use Tylenol and ibuprofen as needed for additional pain relief.  Return to the ER for any fevers increased pain or swelling.

## 2020-05-10 NOTE — ED Provider Notes (Signed)
[facility] EMERGENCY DEPARTMENT Provider Note   CSN: [id number] Arrival date & time: 05/10/20  1636     History Chief Complaint  Patient presents with  . Facial Pain  . Dental Pain    Carlos Collier is a 34 y.o. male presents to the emergency department for evaluation of lower tooth pain.  Patient states the right lower first molar has been cracked and injured years ago.  He has had increased pain around this tooth for 1 week.  Pain radiates into his ear.  He denies any fevers difficulty swallowing or drainage.  Pain is moderate to severe.  He also has pain along the right maxillary and frontal sinus.  He thinks he may have a sinus infection.  He has had some postnasal drainage.  No fevers.  HPI     No past medical history on file.  Patient Active Problem List   Diagnosis Date Noted  . G6PD deficiency 05/18/2017    No past surgical history on file.     No family history on file.  Social History   Tobacco Use  . Smoking status: Current Every Day Smoker    Packs/day: 0.25    Years: 5.00    Pack years: 1.25    Types: Cigarettes  . Smokeless tobacco: Never Used  Vaping Use  . Vaping Use: Never used  Substance Use Topics  . Alcohol use: Yes    Alcohol/week: 5.0 standard drinks    Types: 5 Cans of beer per week  . Drug use: Yes    Types: Cocaine    Home Medications Prior to Admission medications   Medication Sig Start Date End Date Taking? Authorizing Provider  amoxicillin (AMOXIL) 500 MG capsule Take 1 capsule (500 mg total) by mouth 3 (three) times daily. 01/10/20   Enid Derry, PA-C  amoxicillin-clavulanate (AUGMENTIN) 875-125 MG tablet Take 1 tablet by mouth every 12 (twelve) hours for 10 days. 05/10/20 05/20/20  Evon Slack, PA-C  cholecalciferol (VITAMIN D) 1000 UNITS tablet Take 1,000 Units by mouth daily.    [provider]  ferrous sulfate 325 (65 FE) MG tablet Take 325 mg by mouth daily with breakfast.    [provider]  HYDROcodone-acetaminophen (NORCO) 5-325 MG tablet Take 1 tablet by mouth every 4 (four) hours as needed for moderate pain. 05/10/20   Evon Slack, PA-C  ketorolac (TORADOL) 10 MG tablet Take 1 tablet (10 mg total) by mouth every 6 (six) hours as needed. 01/10/20   Enid Derry, PA-C  lidocaine (XYLOCAINE) 2 % solution Use as directed 10 mLs in the mouth or throat as needed. 01/10/20   Enid Derry, PA-C  vitamin C (ASCORBIC ACID) 500 MG tablet Take 500 mg by mouth daily.    [provider]    Allergies    Aspirin, Other, and Shellfish allergy  Review of Systems   Review of Systems  Constitutional: Negative for chills and fever.  HENT: Positive for congestion, dental problem, sinus pressure and sinus pain. Negative for drooling, mouth sores, sore throat and trouble swallowing.   Respiratory: Negative for shortness of breath.   Cardiovascular: Negative for chest pain.  Gastrointestinal: Negative for diarrhea, nausea and vomiting.  Musculoskeletal: Negative for back pain.  Skin: Negative for rash and wound.  Neurological: Negative for dizziness and headaches.    Physical Exam Updated Vital Signs BP (!) 151/107 (BP Location: Left Arm)   Pulse 77   Temp 98.5 F (36.9 C) (Oral)  Resp 20   Ht 5\' 8"  (1.727 m)   Wt 87.5 kg   SpO2 98%   BMI 29.35 kg/m   Physical Exam Constitutional:      Appearance: He is well-developed.  HENT:     Head: Normocephalic and atraumatic.     Comments: Right lower first molar with significant decay.  Minimal gum swelling around the area with mild surrounding soft tissue swelling.  No fluctuance or abscess formation.  No drainage.  No trismus.  No abnormal swelling warmth or redness to the pharynx.  No other lesions intraorally.  Positive right frontal and maxillary sinus tenderness    Right Ear: Tympanic membrane, ear canal and external ear normal.     Left Ear: External ear normal.     Nose: No congestion.     Mouth/Throat:      Pharynx: No oropharyngeal exudate or posterior oropharyngeal erythema.  Eyes:     Conjunctiva/sclera: Conjunctivae normal.  Cardiovascular:     Rate and Rhythm: Normal rate.  Pulmonary:     Effort: Pulmonary effort is normal. No respiratory distress.  Musculoskeletal:        General: Normal range of motion.     Cervical back: Normal range of motion.  Skin:    General: Skin is warm.     Findings: No rash.  Neurological:     General: No focal deficit present.     Mental Status: He is alert and oriented to person, place, and time. Mental status is at baseline.     Cranial Nerves: No cranial nerve deficit.     Motor: No weakness.     Gait: Gait normal.  Psychiatric:        Behavior: Behavior normal.        Thought Content: Thought content normal.     ED Results / Procedures / Treatments   Labs (all labs ordered are listed, but only abnormal results are displayed) Labs Reviewed - No data to display  EKG None  Radiology No results found.  Procedures Procedures (including critical care time)  Medications Ordered in ED Medications  amoxicillin-clavulanate (AUGMENTIN) 875-125 MG per tablet 1 tablet (has no administration in time range)  HYDROcodone-acetaminophen (NORCO/VICODIN) 5-325 MG per tablet 1 tablet (has no administration in time range)    ED Course  I have reviewed the triage vital signs and the nursing notes.  Pertinent labs & imaging results that were available during my care of the patient were reviewed by me and considered in my medical decision making (see chart for details).    MDM Rules/Calculators/A&P                          34 year old male with dental pain.  He has significant dental decay with cracked tooth.  He has some soft tissue swelling and tenderness along the cracked decayed tooth.  He is started on Augmentin and given Norco for pain.  Vital signs are stable, afebrile.  No neurological deficits.  Tolerating p.o. well with no trismus or sign  of pharyngeal infection.  Patient will follow-up with dental clinic.  He understands signs symptoms return to the ER for. Final Clinical Impression(s) / ED Diagnoses Final diagnoses:  Pain, dental  Dental caries  Closed fracture of tooth, initial encounter  Dental infection    Rx / DC Orders ED Discharge Orders         Ordered    amoxicillin-clavulanate (AUGMENTIN) 875-125 MG tablet  Every 12  hours        05/10/20 1748    HYDROcodone-acetaminophen (NORCO) 5-325 MG tablet  Every 4 hours PRN        05/10/20 1748           Evon Slack, PA-C 05/10/20 1756    Shaune Pollack, MD 05/10/20 2142

## 2020-05-10 NOTE — ED Triage Notes (Signed)
Pt ambulatory to triage.  Pt has lower cracked tooth and upper crack tooth. Sx for 1 week.   Pt states my face hurts with sinus pressure.  Pt alert  Speech clear.

## 2020-05-10 NOTE — ED Notes (Signed)
See triage note. Pt sitting calmly in bed. Denies fever.

## 2020-05-17 ENCOUNTER — Emergency Department: Admission: EM | Admit: 2020-05-17 | Discharge: 2020-05-17 | Discharge status: Against Medical Advice | Payer: Self-pay

## 2020-05-17 NOTE — ED Notes (Signed)
Pt called for triage, no response. 

## 2020-05-17 NOTE — ED Triage Notes (Signed)
Pt called for triage, no response. 

## 2020-10-03 ENCOUNTER — Emergency Department
Admission: EM | Admit: 2020-10-03 | Discharge: 2020-10-03 | Disposition: A | Discharge status: Home / Self Care | Payer: Self-pay | Attending: Emergency Medicine | Admitting: Emergency Medicine

## 2020-10-03 ENCOUNTER — Other Ambulatory Visit: Payer: Self-pay

## 2020-10-03 ENCOUNTER — Encounter: Payer: Self-pay | Admitting: Emergency Medicine

## 2020-10-03 DIAGNOSIS — F17210 Nicotine dependence, cigarettes, uncomplicated: Secondary | ICD-10-CM | POA: Insufficient documentation

## 2020-10-03 DIAGNOSIS — K029 Dental caries, unspecified: Secondary | ICD-10-CM | POA: Insufficient documentation

## 2020-10-03 DIAGNOSIS — R112 Nausea with vomiting, unspecified: Secondary | ICD-10-CM | POA: Insufficient documentation

## 2020-10-03 DIAGNOSIS — R6883 Chills (without fever): Secondary | ICD-10-CM | POA: Insufficient documentation

## 2020-10-03 DIAGNOSIS — K047 Periapical abscess without sinus: Secondary | ICD-10-CM | POA: Insufficient documentation

## 2020-10-03 MED ORDER — AMOXICILLIN-POT CLAVULANATE 875-125 MG PO TABS
1.0000 | ORAL_TABLET | Freq: Once | ORAL | Status: AC
  Administered 2020-10-03: 1 via ORAL
  Filled 2020-10-03: qty 1

## 2020-10-03 MED ORDER — TRAMADOL HCL 50 MG PO TABS: 50.0000 mg | ORAL_TABLET | Freq: Three times a day (TID) | ORAL | 0 refills | Status: AC | PRN

## 2020-10-03 MED ORDER — AMOXICILLIN-POT CLAVULANATE 875-125 MG PO TABS: 1.0000 | ORAL_TABLET | Freq: Two times a day (BID) | ORAL | 0 refills | Status: AC

## 2020-10-03 MED ORDER — HYDROCODONE-ACETAMINOPHEN 5-325 MG PO TABS
1.0000 | ORAL_TABLET | Freq: Once | ORAL | Status: AC
  Administered 2020-10-03: 1 via ORAL
  Filled 2020-10-03: qty 1

## 2020-10-03 NOTE — ED Notes (Signed)
Pt verbalizes understanding of d/c instructions, medications and follow up 

## 2020-10-03 NOTE — ED Triage Notes (Signed)
Pt comes into the eD via POV c/o left lower dental pain.  Pt states it is now causing severe pain on the left side of his face.  PT also states he is having sinus issues.  Pt in NAD at this time.

## 2020-10-03 NOTE — Discharge Instructions (Addendum)
Take the antibiotic as directed. Follow-up with your dental provider for definitive management.   OPTIONS FOR DENTAL FOLLOW UP CARE  [place] Department of Health and Human Services - Local Safety Net Dental Clinics [email or web address]   [facility] [phone])  Sharl Ma [phone])  [place] [phone])  [facility] [phone])  [facility] [phone]) This clinic caters to the indigent population and is on a lottery system. Location: [employer] [facility], [employer], [address], [place] Clinic Hours: Wednesdays from 6pm - 9pm, patients seen by a lottery system. For dates, call or go to [email or web address] Services: Cleanings, fillings and simple extractions. Payment Options: DENTAL WORK IS FREE OF CHARGE. Bring proof of income or support. Best way to get seen: Arrive at 5:15 pm - this is a lottery, NOT first come/first serve, so arriving earlier will not increase your chances of being seen.     [facility] [phone] Select option 1 for emergencies   Location: [facility], [place], [address], [place] Clinic Hours: No walk-ins accepted - call the day before to schedule an appointment. Check in times are 9:30 am and 1:30 pm. Services: Simple extractions, temporary fillings, pulpectomy/pulp debridement, uncomplicated abscess drainage. Payment Options: PAYMENT IS DUE AT THE TIME OF SERVICE.  Fee is usually $100-200, additional surgical procedures (e.g. abscess drainage) may be extra. Cash, checks, Visa/MasterCard accepted.  Can file Medicaid if patient is covered for dental - patient should call case worker to check. No discount for [facility] patients. Best way to get seen: MUST call the day before and get onto the schedule. Can usually be seen  the next 1-2 days. No walk-ins accepted.     [facility] [phone]   Location: [facility], [address] [place] Clinic Hours: M, W, Th, F 8am or 1:30pm, Tues 9a or 1:30 - first come/first served. Services: Simple extractions, temporary fillings, uncomplicated abscess drainage.  You do not need to be an [facility] resident. Payment Options: PAYMENT IS DUE AT THE TIME OF SERVICE. Dental insurance, otherwise sliding scale - bring proof of income or support. Depending on income and treatment needed, cost is usually $50-200. Best way to get seen: Arrive early as it is first come/first served.     [facility] [phone]   Location: [address] Clinic Hours: Mon-Thu 8a-5p Services: Most basic dental services including extractions and fillings. Payment Options: PAYMENT IS DUE AT THE TIME OF SERVICE. Sliding scale, up to 50% off - bring proof if income or support. Medicaid with dental option accepted. Best way to get seen: Call to schedule an appointment, can usually be seen within 2 weeks OR they will try to see walk-ins - show up at 8a or 2p (you may have to wait).     [facility] [phone] [place] RESIDENTS ONLY   Location: [facility], [address] [place], [place] [address] Clinic Hours: By appointment only. Monday - Thursday 8am-5pm, Friday 8am-12pm Services: Cleanings, fillings, extractions. Payment Options: PAYMENT IS DUE AT THE TIME OF SERVICE. Cash, Visa or MasterCard. Sliding scale - $30 minimum per service. Best way to get seen: Come in to office, complete packet and make an appointment - need proof of income or support monies for each household member and proof of [facility] residence. Usually takes about a month to get in.  [facility] [phone]   Location: [address],  [place] Clinic Hours: Walk-in Urgent Care Dental Services are offered Monday-Friday mornings only. The numbers of emergencies accepted daily is limited to the number of providers available. Maximum 15 - Mondays, Wednesdays & Thursdays Maximum 10 - Tuesdays & Fridays Services: You do not need to be a [facility] resident to be seen for a dental emergency. Emergencies are defined as pain, swelling, abnormal bleeding, or dental trauma. Walkins will receive x-rays if needed. NOTE: Dental cleaning is not an emergency. Payment Options: PAYMENT IS DUE AT THE TIME OF SERVICE. Minimum co-pay is $40.00 for uninsured patients. Minimum co-pay is $3.00 for Medicaid with dental coverage. Dental Insurance is accepted and must be presented at time of visit. Medicare does not cover dental. Forms of payment: Cash, credit card, checks. Best way to get seen: If not previously registered with the clinic, walk-in dental registration begins at 7:15 am and is on a first come/first serve basis. If previously registered with the clinic, call to make an appointment.     [facility] [place] ONLY   Location: [address], [place], [place] Clinic Hours: Mon-Thu 10a-2p Services: Extractions only! Payment Options: FREE (donations accepted) - bring proof of income or support Best way to get seen: Call and schedule an appointment OR come at 8am on the 1st Monday of every month (except for holidays) when it is first come/first served.     [facility] [phone]   Location: [place] Clinic Hours: Friday mornings Services, Payment Options, Best way to get seen: Call for info

## 2020-10-03 NOTE — ED Provider Notes (Signed)
[facility] Emergency Department Provider Note ____________________________________________  Time seen: 1735  I have reviewed the triage vital signs and the nursing notes.  HISTORY  Chief Complaint  Dental Pain   HPI Carlos Collier is a 35 y.o. male presents himself to the ED for evaluation of persistent intermittent left lower jaw pain secondary to chronically broken molar.  Patient reports he has given some $1500 to a local dental provider, but has also been rescheduled and pushed back by the same provider over the last 3 months.  He called the provider with the request for an antibiotic , and the provider refused.  Patient presents to the ED for evaluation of several days of left lower jaw pain.  He denies any interim fevers, but does report some chills, nausea with vomiting, and some pain with jaw movement.  He denies any difficulty controlling oral secretions or maintaining his airway.  He apparently has been taking high doses of ibuprofen over the last several days with limited benefit.  History reviewed. No pertinent past medical history.  Patient Active Problem List   Diagnosis Date Noted  . G6PD deficiency 05/18/2017    History reviewed. No pertinent surgical history.  Prior to Admission medications   Medication Sig Start Date End Date Taking? Authorizing Provider  amoxicillin-clavulanate (AUGMENTIN) 875-125 MG tablet Take 1 tablet by mouth 2 (two) times daily for 10 days. 10/03/20 10/13/20 Yes Magan Winnett, Charlesetta Ivory, PA-C  traMADol (ULTRAM) 50 MG tablet Take 1 tablet (50 mg total) by mouth 3 (three) times daily as needed for up to 3 days. 10/03/20 10/06/20 Yes Devon Pretty, Charlesetta Ivory, PA-C  cholecalciferol (VITAMIN D) 1000 UNITS tablet Take 1,000 Units by mouth daily.    [provider]  ferrous sulfate 325 (65 FE) MG tablet Take 325 mg by mouth daily with breakfast.    [provider]  vitamin C (ASCORBIC ACID) 500 MG tablet Take 500 mg by  mouth daily.    [provider]    Allergies Aspirin, Other, and Shellfish allergy  History reviewed. No pertinent family history.  Social History Social History   Tobacco Use  . Smoking status: Current Every Day Smoker    Packs/day: 0.25    Years: 5.00    Pack years: 1.25    Types: Cigarettes  . Smokeless tobacco: Never Used  Vaping Use  . Vaping Use: Never used  Substance Use Topics  . Alcohol use: Yes    Alcohol/week: 5.0 standard drinks    Types: 5 Cans of beer per week  . Drug use: Yes    Review of Systems  Constitutional: Negative for fever. Eyes: Negative for visual changes. ENT: Negative for sore throat.  Left lower dental/jaw pain as above. Cardiovascular: Negative for chest pain. Respiratory: Negative for shortness of breath. Gastrointestinal: Negative for abdominal pain, vomiting and diarrhea. Genitourinary: Negative for dysuria. Musculoskeletal: Negative for back pain. Skin: Negative for rash. Neurological: Negative for headaches, focal weakness or numbness. ____________________________________________  PHYSICAL EXAM:  VITAL SIGNS: ED Triage Vitals  Enc Vitals Group     BP 10/03/20 1544 (!) 144/96     Pulse Rate 10/03/20 1544 (!) 54     Resp 10/03/20 1544 17     Temp 10/03/20 1544 97.6 F (36.4 C)     Temp Source 10/03/20 1544 Oral     SpO2 10/03/20 1544 100 %     Weight 10/03/20 1546 200 lb (90.7 kg)     Height 10/03/20 1546 5'  8" (1.727 m)     Head Circumference --      Peak Flow --      Pain Score 10/03/20 1550 10     Pain Loc --      Pain Edu? --      Excl. in GC? --     Constitutional: Alert and oriented. Well appearing and in no distress. Head: Normocephalic and atraumatic. Eyes: Conjunctivae are normal. PERRL. Normal extraocular movements Ears: Canals clear. TMs intact bilaterally. Nose: No congestion/rhinorrhea/epistaxis. Mouth/Throat: Mucous membranes are moist.  Uvula is midline and tonsils are flat.  No oropharyngeal  lesions are appreciated.  Patient with a chronically broken third molar on the left lower jaw with complete erosion of the pulp and dentin.  No surrounding gum swelling or ecchymosis is appreciated.  No pointing, fluctuance, or spontaneous purulent drainage noted.  No brawny sublingual edema is appreciated. Neck: Supple. No thyromegaly. Hematological/Lymphatic/Immunological: No cervical lymphadenopathy. Cardiovascular: Normal rate, regular rhythm. Normal distal pulses. Respiratory: Normal respiratory effort. No wheezes/rales/rhonchi. Gastrointestinal: Soft and nontender. No distention. Musculoskeletal: Nontender with normal range of motion in all extremities.  Neurologic:  Normal gait without ataxia. Normal speech and language. No gross focal neurologic deficits are appreciated. Skin:  Skin is warm, dry and intact. No rash noted. ____________________________________________  PROCEDURES  Augmentin 875 mg PO Norco 5-325 mg PO  Procedures ____________________________________________  INITIAL IMPRESSION / ASSESSMENT AND PLAN / ED COURSE  DDX: dental abscess, Ludwig angina, dental caries, dental fracture  Patient with ED evaluation of chronic, intermittent left lower jaw and dental pain. He has a chronically broken & decayed 3rd molar. He has prepaid, been scheduled, and subsequently rescheduled for extraction with a local dental provider. He will be started on Augmentin and referred to a local dental provider for definitive management. Return precautions have again been reviewed.    Helmer Dull was evaluated in Emergency Department on 10/03/2020 for the symptoms described in the history of present illness. He was evaluated in the context of the global COVID-19 pandemic, which necessitated consideration that the patient might be at risk for infection with the SARS-CoV-2 virus that causes COVID-19. Institutional protocols and algorithms that pertain to the evaluation of patients at risk for  COVID-19 are in a state of rapid change based on information released by regulatory bodies including the CDC and federal and state organizations. These policies and algorithms were followed during the patient's care in the ED.  I reviewed the patient's prescription history over the last 12 months in the multi-state controlled substances database(s) that includes [place], [place], [place], [place], [place], [place], [place], [place], [place], [place], [place], [place], [place], and [place].  Results were notable for no current RX. ____________________________________________  FINAL CLINICAL IMPRESSION(S) / ED DIAGNOSES  Final diagnoses:  Pain due to dental caries  Dental infection      Lissa Hoard, PA-C 10/03/20 1809    Chesley Noon, MD 10/03/20 2329

## 2020-10-17 ENCOUNTER — Emergency Department: Payer: Self-pay

## 2020-10-17 ENCOUNTER — Emergency Department
Admission: EM | Admit: 2020-10-17 | Discharge: 2020-10-17 | Disposition: A | Discharge status: Home / Self Care | Payer: Self-pay | Attending: Emergency Medicine | Admitting: Emergency Medicine

## 2020-10-17 DIAGNOSIS — F17210 Nicotine dependence, cigarettes, uncomplicated: Secondary | ICD-10-CM | POA: Insufficient documentation

## 2020-10-17 DIAGNOSIS — H5371 Glare sensitivity: Secondary | ICD-10-CM | POA: Insufficient documentation

## 2020-10-17 DIAGNOSIS — K2960 Other gastritis without bleeding: Secondary | ICD-10-CM | POA: Insufficient documentation

## 2020-10-17 DIAGNOSIS — R519 Headache, unspecified: Secondary | ICD-10-CM | POA: Insufficient documentation

## 2020-10-17 DIAGNOSIS — K029 Dental caries, unspecified: Secondary | ICD-10-CM | POA: Insufficient documentation

## 2020-10-17 DIAGNOSIS — R1110 Vomiting, unspecified: Secondary | ICD-10-CM | POA: Insufficient documentation

## 2020-10-17 LAB — CBC WITH DIFFERENTIAL/PLATELET
Abs Immature Granulocytes: 0.01 10*3/uL (ref 0.00–0.07)
Basophils Absolute: 0 10*3/uL (ref 0.0–0.1)
Basophils Relative: 0 %
Eosinophils Absolute: 0.1 10*3/uL (ref 0.0–0.5)
Eosinophils Relative: 2 %
HCT: 46.6 % (ref 39.0–52.0)
Hemoglobin: 15.7 g/dL (ref 13.0–17.0)
Immature Granulocytes: 0 %
Lymphocytes Relative: 37 %
Lymphs Abs: 2.6 10*3/uL (ref 0.7–4.0)
MCH: 34.4 pg — ABNORMAL HIGH (ref 26.0–34.0)
MCHC: 33.7 g/dL (ref 30.0–36.0)
MCV: 102 fL — ABNORMAL HIGH (ref 80.0–100.0)
Monocytes Absolute: 0.3 10*3/uL (ref 0.1–1.0)
Monocytes Relative: 5 %
Neutro Abs: 3.9 10*3/uL (ref 1.7–7.7)
Neutrophils Relative %: 56 %
Platelets: 252 10*3/uL (ref 150–400)
RBC: 4.57 MIL/uL (ref 4.22–5.81)
RDW: 11.6 % (ref 11.5–15.5)
WBC: 7 10*3/uL (ref 4.0–10.5)
nRBC: 0 % (ref 0.0–0.2)

## 2020-10-17 LAB — COMPREHENSIVE METABOLIC PANEL
ALT: 42 U/L (ref 0–44)
AST: 34 U/L (ref 15–41)
Albumin: 4.2 g/dL (ref 3.5–5.0)
Alkaline Phosphatase: 59 U/L (ref 38–126)
Anion gap: 9 (ref 5–15)
BUN: 8 mg/dL (ref 6–20)
CO2: 29 mmol/L (ref 22–32)
Calcium: 9.8 mg/dL (ref 8.9–10.3)
Chloride: 102 mmol/L (ref 98–111)
Creatinine, Ser: 0.93 mg/dL (ref 0.61–1.24)
GFR, Estimated: >60 mL/min (ref >60)
Glucose, Bld: 98 mg/dL (ref 70–99)
Potassium: 4.2 mmol/L (ref 3.5–5.1)
Sodium: 140 mmol/L (ref 135–145)
Total Bilirubin: 1.7 mg/dL — ABNORMAL HIGH (ref 0.3–1.2)
Total Protein: 7.6 g/dL (ref 6.5–8.1)

## 2020-10-17 MED ORDER — TRAMADOL HCL 50 MG PO TABS: 50.0000 mg | ORAL_TABLET | Freq: Four times a day (QID) | ORAL | 0 refills | Status: DC | PRN

## 2020-10-17 MED ORDER — FAMOTIDINE 20 MG PO TABS: 20.0000 mg | ORAL_TABLET | Freq: Two times a day (BID) | ORAL | 0 refills | Status: DC

## 2020-10-17 NOTE — ED Provider Notes (Signed)
[facility] Emergency Department Provider Note  ____________________________________________   Event Date/Time   First MD Initiated Contact with Patient 10/17/20 [phone]     (approximate)  I have reviewed the triage vital signs and the nursing notes.   HISTORY  Chief Complaint Headache   HPI Carlos Collier is a 35 y.o. male presents to the ED with complaint of left-sided headache, light sensitivity, emesis x2 in the last 24 hours.  Patient denies any fever or chills.  He states that he was seen in the emergency department on 10/03/2020 where he was diagnosed with a dental carry and infection.  Patient was under the impression that the Augmentin also was for his sinuses and he has 1 day of antibiotics left.  Patient denies any dizziness or visual disturbance other than his light sensitivity.  He has not yet seen a dentist as he does not have a regular dentist to schedule with.  Patient also describes gastric reflux which he has been having for "a long time".  Patient has been taking Tums with relief up until recently in which the Tums do not seem to be helping.  He states his wife told him he is taking too many each day.  He has never used any other over-the-counter medication for reflux.  He states that alcohol/pizza and similar foods seems to make it worse.         History reviewed. No pertinent past medical history.  Patient Active Problem List   Diagnosis Date Noted  . G6PD deficiency 05/18/2017    History reviewed. No pertinent surgical history.  Prior to Admission medications   Medication Sig Start Date End Date Taking? Authorizing Provider  famotidine (PEPCID) 20 MG tablet Take 1 tablet (20 mg total) by mouth 2 (two) times daily. 10/17/20 10/17/21 Yes Summers, Rhonda L, PA-C  traMADol (ULTRAM) 50 MG tablet Take 1 tablet (50 mg total) by mouth every 6 (six) hours as needed. 10/17/20  Yes Bridget Hartshorn L, PA-C  cholecalciferol (VITAMIN D) 1000 UNITS tablet  Take 1,000 Units by mouth daily.    [provider]  ferrous sulfate 325 (65 FE) MG tablet Take 325 mg by mouth daily with breakfast.    [provider]  vitamin C (ASCORBIC ACID) 500 MG tablet Take 500 mg by mouth daily.    [provider]    Allergies Aspirin, Other, and Shellfish allergy  History reviewed. No pertinent family history.  Social History Social History   Tobacco Use  . Smoking status: Current Every Day Smoker    Packs/day: 0.25    Years: 5.00    Pack years: 1.25    Types: Cigarettes  . Smokeless tobacco: Never Used  Vaping Use  . Vaping Use: Never used  Substance Use Topics  . Alcohol use: Yes    Alcohol/week: 5.0 standard drinks    Types: 5 Cans of beer per week  . Drug use: Yes    Review of Systems Constitutional: No fever/chills Eyes: No visual changes. ENT: No sore throat.  Cardiovascular: Denies chest pain. Respiratory: Denies shortness of breath. Gastrointestinal: No abdominal pain.  No nausea, no vomiting.  Musculoskeletal: Negative for musculoskeletal pain. Skin: Negative for rash. Neurological: Positive for left-sided headache.  Negative for focal weakness or numbness.  ____________________________________________   PHYSICAL EXAM:  VITAL SIGNS: ED Triage Vitals  Enc Vitals Group     BP 10/17/20 0909 124/90     Pulse Rate 10/17/20 0909 68     Resp  10/17/20 0909 18     Temp 10/17/20 0909 98.4 F (36.9 C)     Temp Source 10/17/20 0909 Oral     SpO2 10/17/20 0910 98 %     Weight 10/17/20 0913 200 lb (90.7 kg)     Height 10/17/20 0913 5\' 9"  (1.753 m)     Head Circumference --      Peak Flow --      Pain Score 10/17/20 0911 0     Pain Loc --      Pain Edu? --      Excl. in GC? --     Constitutional: Alert and oriented. Well appearing and in no acute distress. Eyes: Conjunctivae are normal. PERRL. EOMI. Head: Atraumatic. Nose: No congestion/rhinnorhea.  EACs and TMs are clear bilaterally. Mouth/Throat:  Mucous membranes are moist.  Oropharynx non-erythematous.  Dental carry still present left lower molar.  No active drainage or edema noted to the gums.  Tooth is in very poor repair. Neck: No stridor.   Cardiovascular: Normal rate, regular rhythm. Grossly normal heart sounds.  Good peripheral circulation. Respiratory: Normal respiratory effort.  No retractions. Lungs CTAB. Gastrointestinal: Soft and nontender. No distention.  Bowel sounds normoactive x4 quadrants.  There is no epigastric or right upper quadrant tenderness.  Patient does give a history consistent with chronic reflux. Musculoskeletal: Moves upper and lower extremities with any difficulty.  Normal gait was noted. Neurologic:  Normal speech and language.  Cranial nerves II through XII grossly intact.  No gross focal neurologic deficits are appreciated.  Patient was ambulatory without any assistance. Skin:  Skin is warm, dry and intact. No rash noted. Psychiatric: Mood and affect are normal. Speech and behavior are normal.  ____________________________________________   LABS (all labs ordered are listed, but only abnormal results are displayed)  Labs Reviewed  CBC WITH DIFFERENTIAL/PLATELET - Abnormal; Notable for the following components:      Result Value   MCV 102.0 (*)    MCH 34.4 (*)    All other components within normal limits  COMPREHENSIVE METABOLIC PANEL - Abnormal; Notable for the following components:   Total Bilirubin 1.7 (*)    All other components within normal limits   ____________________________________________ ____________________________________________  RADIOLOGY 10/19/20, personally viewed and evaluated these images (plain radiographs) as part of my medical decision making, as well as reviewing the written report by the radiologist.   Official radiology report(s): CT Head Wo Contrast  Result Date: 10/17/2020 CLINICAL DATA:  Headache. EXAM: CT HEAD WITHOUT CONTRAST TECHNIQUE: Contiguous  axial images were obtained from the base of the skull through the vertex without intravenous contrast. COMPARISON:  May 28, 2019. FINDINGS: Brain: No evidence of acute infarction, hemorrhage, hydrocephalus, extra-axial collection or mass lesion/mass effect. Vascular: No hyperdense vessel or unexpected calcification. Skull: Normal. Negative for fracture or focal lesion. Sinuses/Orbits: No acute finding. Other: None. IMPRESSION: Normal head CT. Electronically Signed   By: May 30, 2019 M.D.   On: 10/17/2020 10:12    ____________________________________________   PROCEDURES  Procedure(s) performed (including Critical Care):  Procedures   ____________________________________________   INITIAL IMPRESSION / ASSESSMENT AND PLAN / ED COURSE  As part of my medical decision making, I reviewed the following data within the electronic MEDICAL RECORD NUMBER Notes from prior ED visits and [place] Controlled Substance Database  35 year old male presents to the ED with concerns of left-sided headache.  Patient was seen recently for a dental infection and also was told that he had sinusitis.  He was treated with Augmentin 875 which she has taken since being seen and has 1 day of antibiotics left.  Physical exam was benign.  Lab work was reassuring.  Patient also had a CT of his head which was negative for any acute findings.  Patient has a history of chronic reflux and has been using Tums frequently which worked initially but now he is needing to take a larger quantity.  He has not talked to his PCP about this.  No over-the-counter medications have been taking other than the Tums.  Patient was encouraged to discontinue taking the Tums and a prescription for Pepcid 20 mg 1 twice daily was sent to the pharmacy.  Patient is to finish his Augmentin completely.  A prescription for tramadol if needed for headache 1 every 6 hours and patient is aware that he cannot drive or operate machinery while taking this medication.   Is to follow-up with his PCP.    ____________________________________________   FINAL CLINICAL IMPRESSION(S) / ED DIAGNOSES  Final diagnoses:  Nonintractable headache, unspecified chronicity pattern, unspecified headache type  Dental caries  Reflux gastritis     ED Discharge Orders         Ordered    famotidine (PEPCID) 20 MG tablet  2 times daily        10/17/20 1100    traMADol (ULTRAM) 50 MG tablet  Every 6 hours PRN        10/17/20 1100          *Please note:  Carlos Collier was evaluated in Emergency Department on 10/17/2020 for the symptoms described in the history of present illness. He was evaluated in the context of the global COVID-19 pandemic, which necessitated consideration that the patient might be at risk for infection with the SARS-CoV-2 virus that causes COVID-19. Institutional protocols and algorithms that pertain to the evaluation of patients at risk for COVID-19 are in a state of rapid change based on information released by regulatory bodies including the CDC and federal and state organizations. These policies and algorithms were followed during the patient's care in the ED.  Some ED evaluations and interventions may be delayed as a result of limited staffing during and the pandemic.*   Note:  This document was prepared using Dragon voice recognition software and may include unintentional dictation errors.    Tommi Rumps, PA-C 10/17/20 1331    Delton Pra[place], MD 10/17/20 [phone]

## 2020-10-17 NOTE — ED Triage Notes (Signed)
Pt arrives via pov, ambulatory to triage. Pt c/o left sided headache, with light sensitivity, emesis x 2 in last 24 hours.pt reports being seen here recently and given Augmentin and tramadol for sinus infection, dental pain/infection on left upper side. NAD noted at this time.

## 2020-10-17 NOTE — Discharge Instructions (Addendum)
Call make an appointment with your primary care provider if any continued problems or concerns.  Discontinue taking the Tums and begin taking the famotidine which is twice a day every day until finished.  If this is not helping your primary care provider may refer you to a gastroenterologist for further work-up of your reflux.  The tramadol was sent to the pharmacy.  This is every 6 hours as needed for headache.  Do not drive or operate machinery while taking this medication.  Finish your antibiotic.  Drink lots of fluids.  Avoid foods that are high in acid such as tomato paste, alcohol, caffeine and also any particular foods that you feel are making your reflux worse.

## 2020-11-23 ENCOUNTER — Ambulatory Visit: Payer: Self-pay

## 2020-12-04 ENCOUNTER — Ambulatory Visit: Payer: Self-pay

## 2020-12-05 ENCOUNTER — Other Ambulatory Visit: Payer: Self-pay

## 2020-12-05 ENCOUNTER — Ambulatory Visit: Payer: Self-pay | Admitting: Physician Assistant

## 2020-12-05 DIAGNOSIS — Z113 Encounter for screening for infections with a predominantly sexual mode of transmission: Secondary | ICD-10-CM

## 2020-12-05 LAB — GRAM STAIN

## 2020-12-06 ENCOUNTER — Encounter: Payer: Self-pay | Admitting: Physician Assistant

## 2020-12-06 NOTE — Progress Notes (Signed)
   [facility] Department STI clinic/screening visit  Subjective:  Carlos Collier is a 35 y.o. male being seen today for an STI screening visit. The patient reports they do not have symptoms.    Patient has the following medical conditions:   Patient Active Problem List   Diagnosis Date Noted  . 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 SEXUALLY TRANSMITTED DISEASE    screening    HPI  Patient reports that he is not having symptoms but would like a screening today.  Denies chronic conditions, surgeries and regular medicines.  States that he has G6PD deficiency.  Reports that he recently had oral surgery for a wisdom tooth and has been using pain medicine and Oragel as needed.  States that his last HIV test was about 1 month ago and last void prior to sample collection for Gram stain was about 2 hr ago.   See flowsheet for further details and programmatic requirements.    The following portions of the patient's history were reviewed and updated as appropriate: allergies, current medications, past medical history, past social history, past surgical history and problem list.  Objective:  There were no vitals filed for this visit.  Physical Exam Constitutional:      General: He is not in acute distress.    Appearance: Normal appearance.  HENT:     Head: Normocephalic and atraumatic.     Comments: No nits,lice, or hair loss. No cervical, supraclavicular or axillary adenopathy.    Mouth/Throat:     Mouth: Mucous membranes are moist.     Pharynx: Oropharynx is clear. No oropharyngeal exudate or posterior oropharyngeal erythema.  Eyes:     Conjunctiva/sclera: Conjunctivae normal.  Pulmonary:     Effort: Pulmonary effort is normal.  Abdominal:     Palpations: Abdomen is soft. There is no mass.     Tenderness: There is no abdominal tenderness. There is no guarding or rebound.  Genitourinary:    Penis: Normal.      Testes: Normal.      Comments: Pubic area without nits, lice, hair loss, edema, erythema, lesions and inguinal adenopathy.  Penis circumcised without rash, lesions and discharge at meatus. Testicles descended bilaterally,nt, no masses or edema. Musculoskeletal:     Cervical back: Neck supple. No tenderness.  Skin:    General: Skin is warm and dry.     Findings: No bruising, erythema, lesion or rash.  Neurological:     Mental Status: He is alert and oriented to person, place, and time.  Psychiatric:        Mood and Affect: Mood normal.        Behavior: Behavior normal.        Thought Content: Thought content normal.        Judgment: Judgment normal.       Assessment and Plan:  Carlos Collier is a 35 y.o. male presenting to the [facility] Department for STI screening  1. Screening for STD (sexually transmitted disease) Patient into clinic without symptoms. Reviewed with patient that Gram stain is normal and no treatment is indicated today. Rec condoms with all sex. Await test results.  Counseled that RN will call if needs to RTC for treatment once results are back. - Gram stain - Gonococcus culture - HIV [place] [facility] - Syphilis Serology, [facility]     No follow-ups on file.  No future appointments.  Matt Holmes, PA

## 2020-12-09 LAB — GONOCOCCUS CULTURE

## 2020-12-11 LAB — HM HIV SCREENING LAB: HM HIV Screening: NEGATIVE

## 2021-02-20 ENCOUNTER — Emergency Department: Payer: BC Managed Care – PPO

## 2021-02-20 ENCOUNTER — Encounter: Payer: Self-pay | Admitting: Emergency Medicine

## 2021-02-20 ENCOUNTER — Emergency Department
Admission: EM | Admit: 2021-02-20 | Discharge: 2021-02-20 | Disposition: A | Discharge status: Home / Self Care | Payer: BC Managed Care – PPO | Attending: Emergency Medicine | Admitting: Emergency Medicine

## 2021-02-20 ENCOUNTER — Other Ambulatory Visit: Payer: Self-pay

## 2021-02-20 DIAGNOSIS — R059 Cough, unspecified: Secondary | ICD-10-CM | POA: Diagnosis not present

## 2021-02-20 DIAGNOSIS — R1012 Left upper quadrant pain: Secondary | ICD-10-CM | POA: Diagnosis present

## 2021-02-20 DIAGNOSIS — K2900 Acute gastritis without bleeding: Secondary | ICD-10-CM | POA: Diagnosis not present

## 2021-02-20 DIAGNOSIS — F17210 Nicotine dependence, cigarettes, uncomplicated: Secondary | ICD-10-CM | POA: Diagnosis not present

## 2021-02-20 DIAGNOSIS — K5900 Constipation, unspecified: Secondary | ICD-10-CM | POA: Diagnosis not present

## 2021-02-20 DIAGNOSIS — R079 Chest pain, unspecified: Secondary | ICD-10-CM | POA: Diagnosis not present

## 2021-02-20 LAB — CBC
HCT: 44.5 % (ref 39.0–52.0)
Hemoglobin: 15.4 g/dL (ref 13.0–17.0)
MCH: 34.4 pg — ABNORMAL HIGH (ref 26.0–34.0)
MCHC: 34.6 g/dL (ref 30.0–36.0)
MCV: 99.3 fL (ref 80.0–100.0)
Platelets: 212 10*3/uL (ref 150–400)
RBC: 4.48 MIL/uL (ref 4.22–5.81)
RDW: 11.8 % (ref 11.5–15.5)
WBC: 6.6 10*3/uL (ref 4.0–10.5)
nRBC: 0 % (ref 0.0–0.2)

## 2021-02-20 LAB — BASIC METABOLIC PANEL
Anion gap: 11 (ref 5–15)
BUN: 12 mg/dL (ref 6–20)
CO2: 26 mmol/L (ref 22–32)
Calcium: 9.5 mg/dL (ref 8.9–10.3)
Chloride: 102 mmol/L (ref 98–111)
Creatinine, Ser: 1.03 mg/dL (ref 0.61–1.24)
GFR, Estimated: >60 mL/min (ref >60)
Glucose, Bld: 100 mg/dL — ABNORMAL HIGH (ref 70–99)
Potassium: 4.6 mmol/L (ref 3.5–5.1)
Sodium: 139 mmol/L (ref 135–145)

## 2021-02-20 LAB — TROPONIN I (HIGH SENSITIVITY): Troponin I (High Sensitivity): 2 ng/L (ref <18)

## 2021-02-20 MED ORDER — SUCRALFATE 1 G PO TABS: 1.0000 g | ORAL_TABLET | Freq: Four times a day (QID) | ORAL | 1 refills | Status: DC

## 2021-02-20 MED ORDER — FAMOTIDINE 20 MG PO TABS: 20.0000 mg | ORAL_TABLET | Freq: Two times a day (BID) | ORAL | 0 refills | Status: DC

## 2021-02-20 MED ORDER — METOCLOPRAMIDE HCL 10 MG PO TABS: 10.0000 mg | ORAL_TABLET | Freq: Four times a day (QID) | ORAL | 0 refills | Status: DC | PRN

## 2021-02-20 MED ORDER — METOCLOPRAMIDE HCL 10 MG PO TABS: 10.0000 mg | ORAL_TABLET | Freq: Four times a day (QID) | ORAL | 0 refills | Status: AC | PRN

## 2021-02-20 NOTE — ED Triage Notes (Signed)
C/O left sided chest pain, epigastric burning, cough x 2 days.  Reports increased belching.  AAOx3.  Skin warm and dry. NAD

## 2021-02-20 NOTE — ED Provider Notes (Signed)
[facility] Emergency Department Provider Note  ____________________________________________  Time seen: Approximately 11:12 AM  I have reviewed the triage vital signs and the nursing notes.   HISTORY  Chief Complaint Chest Pain    HPI Carlos Collier is a 35 y.o. male no significant past medical history who complains of left upper abdomen pain which radiates up into the chest and feels like burning, worse at night, associated with a dry cough.  No chest pain or shortness of breath.  No fever.  Also reporting constipation.  Symptoms intermittent, not exertional, not pleuritic.  No vomiting  Does report being very stressed at work lately.    History reviewed. No pertinent past medical history.   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 No pertinent surgical history.   Prior to Admission medications   Medication Sig Start Date End Date Taking? Authorizing Provider  cholecalciferol (VITAMIN D) 1000 UNITS tablet Take 1,000 Units by mouth daily. Patient not taking: Reported on 12/06/2020    [provider]  famotidine (PEPCID) 20 MG tablet Take 1 tablet (20 mg total) by mouth 2 (two) times daily. Patient not taking: Reported on 12/06/2020 10/17/20 10/17/21  Tommi Rumps, PA-C  famotidine (PEPCID) 20 MG tablet Take 1 tablet (20 mg total) by mouth 2 (two) times daily. 02/20/21   Sharman Cheek, MD  ferrous sulfate 325 (65 FE) MG tablet Take 325 mg by mouth daily with breakfast. Patient not taking: Reported on 12/06/2020    [provider]  metoCLOPramide (REGLAN) 10 MG tablet Take 1 tablet (10 mg total) by mouth every 6 (six) hours as needed. 02/20/21   Sharman Cheek, MD  sucralfate (CARAFATE) 1 g tablet Take 1 tablet (1 g total) by mouth 4 (four) times daily. 02/20/21   Sharman Cheek, MD  traMADol (ULTRAM) 50 MG tablet Take 1 tablet (50 mg total) by mouth every 6 (six)  hours as needed. Patient not taking: Reported on 12/06/2020 10/17/20   Tommi Rumps, PA-C  vitamin C (ASCORBIC ACID) 500 MG tablet Take 500 mg by mouth daily. Patient not taking: Reported on 12/06/2020    [provider]     Allergies Aspirin, Other, and Shellfish allergy   No family history on file.  Social History Social History   Tobacco Use   Smoking status: Every Day    Packs/day: 0.25    Years: 5.00    Pack years: 1.25    Types: Cigarettes   Smokeless tobacco: Never  Vaping Use   Vaping Use: Never used  Substance Use Topics   Alcohol use: Yes    Alcohol/week: 5.0 standard drinks    Types: 5 Cans of beer per week   Drug use: Yes    Types: Marijuana    Review of Systems  Constitutional:   No fever or chills.  ENT:   No sore throat. No rhinorrhea. Cardiovascular:   No chest pain or syncope. Respiratory:   No dyspnea or cough. Gastrointestinal:   Positive upper abdominal pain as above without vomiting and diarrhea.  Musculoskeletal:   Negative for focal pain or swelling All other systems reviewed and are negative except as documented above in ROS and HPI.  ____________________________________________   PHYSICAL EXAM:  VITAL SIGNS: ED Triage Vitals  Enc Vitals Group     BP 02/20/21 0840 (!) 126/93     Pulse Rate 02/20/21 0840 70     Resp  02/20/21 0840 14     Temp 02/20/21 0840 98.1 F (36.7 C)     Temp Source 02/20/21 0840 Oral     SpO2 02/20/21 0840 97 %     Weight 02/20/21 0835 199 lb 15.3 oz (90.7 kg)     Height 02/20/21 0835 5\' 9"  (1.753 m)     Head Circumference --      Peak Flow --      Pain Score 02/20/21 0834 7     Pain Loc --      Pain Edu? --      Excl. in GC? --     Vital signs reviewed, nursing assessments reviewed.   Constitutional:   Alert and oriented. Non-toxic appearance. Eyes:   Conjunctivae are normal. EOMI. PERRL. ENT      Head:   Normocephalic and atraumatic.      Nose:   Wearing a mask.      Mouth/Throat:    Wearing a mask.      Neck:   No meningismus. Full ROM. Hematological/Lymphatic/Immunilogical:   No cervical lymphadenopathy. Cardiovascular:   RRR. Symmetric bilateral radial and DP pulses.  No murmurs. Cap refill less than 2 seconds. Respiratory:   Normal respiratory effort without tachypnea/retractions. Breath sounds are clear and equal bilaterally. No wheezes/rales/rhonchi. Gastrointestinal:   Soft with left upper quadrant tenderness. Non distended. There is no CVA tenderness.  No rebound, rigidity, or guarding. Genitourinary:   deferred Musculoskeletal:   Normal range of motion in all extremities. No joint effusions.  No lower extremity tenderness.  No edema. Neurologic:   Normal speech and language.  Motor grossly intact. No acute focal neurologic deficits are appreciated.  Skin:    Skin is warm, dry and intact. No rash noted.  No petechiae, purpura, or bullae.  ____________________________________________    LABS (pertinent positives/negatives) (all labs ordered are listed, but only abnormal results are displayed) Labs Reviewed  BASIC METABOLIC PANEL - Abnormal; Notable for the following components:      Result Value   Glucose, Bld 100 (*)    All other components within normal limits  CBC - Abnormal; Notable for the following components:   MCH 34.4 (*)    All other components within normal limits  TROPONIN I (HIGH SENSITIVITY)   ____________________________________________   EKG  Interpreted by me Normal sinus rhythm rate of 62, normal axis and intervals.  Normal QRS ST segments and T waves  ____________________________________________    RADIOLOGY  DG Chest 2 View  Result Date: 02/20/2021 CLINICAL DATA:  Left-sided chest pain and cough for 2 days EXAM: CHEST - 2 VIEW COMPARISON:  None. FINDINGS: Cardiac shadow is within normal limits. The lungs are well aerated without focal infiltrate or sizable effusion. Mild peribronchial thickening is noted likely related to  bronchitis. No acute bony abnormality is noted. IMPRESSION: Mild peribronchial thickening likely related to bronchitis. Electronically Signed   By: 02/22/2021 M.D.   On: 02/20/2021 08:58    ____________________________________________   PROCEDURES Procedures  ____________________________________________  DIFFERENTIAL DIAGNOSIS   GERD/gastritis, non-STEMI, pleural effusion, pneumothorax  CLINICAL IMPRESSION / ASSESSMENT AND PLAN / ED COURSE  Medications ordered in the ED: Medications - No data to display  Pertinent labs & imaging results that were available during my care of the patient were reviewed by me and considered in my medical decision making (see chart for details).  Jermie Hippe was evaluated in Emergency Department on 02/20/2021 for the symptoms described in the history of present illness. He  was evaluated in the context of the global COVID-19 pandemic, which necessitated consideration that the patient might be at risk for infection with the SARS-CoV-2 virus that causes COVID-19. Institutional protocols and algorithms that pertain to the evaluation of patients at risk for COVID-19 are in a state of rapid change based on information released by regulatory bodies including the CDC and federal and state organizations. These policies and algorithms were followed during the patient's care in the ED.   Patient presents with left upper quadrant pain and some mild tenderness consistent with gastritis.  Vitals and exam are reassuring, labs EKG chest x-ray all unremarkable.  With multiple days of symptoms, single troponin is sufficient.  Doubt ACS PE dissection AAA pericarditis pancreatitis biliary disease or bowel perforation.  No evidence of bowel obstruction, stable for discharge     ____________________________________________   FINAL CLINICAL IMPRESSION(S) / ED DIAGNOSES    Final diagnoses:  Acute gastritis without hemorrhage, unspecified gastritis type     ED Discharge  Orders          Ordered    sucralfate (CARAFATE) 1 g tablet  4 times daily,   Status:  Discontinued        02/20/21 1106    famotidine (PEPCID) 20 MG tablet  2 times daily,   Status:  Discontinued        02/20/21 1106    metoCLOPramide (REGLAN) 10 MG tablet  Every 6 hours PRN,   Status:  Discontinued        02/20/21 1106    famotidine (PEPCID) 20 MG tablet  2 times daily        02/20/21 1111    metoCLOPramide (REGLAN) 10 MG tablet  Every 6 hours PRN        02/20/21 1111    sucralfate (CARAFATE) 1 g tablet  4 times daily        02/20/21 1111            Portions of this note were generated with dragon dictation software. Dictation errors may occur despite best attempts at proofreading.    Sharman Cheek, MD 02/20/21 1124

## 2021-03-23 ENCOUNTER — Other Ambulatory Visit: Payer: Self-pay

## 2021-03-23 ENCOUNTER — Emergency Department
Admission: EM | Admit: 2021-03-23 | Discharge: 2021-03-23 | Disposition: A | Discharge status: Home / Self Care | Payer: BC Managed Care – PPO | Attending: Emergency Medicine | Admitting: Emergency Medicine

## 2021-03-23 ENCOUNTER — Emergency Department: Payer: BC Managed Care – PPO

## 2021-03-23 DIAGNOSIS — R1013 Epigastric pain: Secondary | ICD-10-CM | POA: Diagnosis present

## 2021-03-23 DIAGNOSIS — F17210 Nicotine dependence, cigarettes, uncomplicated: Secondary | ICD-10-CM | POA: Diagnosis not present

## 2021-03-23 DIAGNOSIS — K279 Peptic ulcer, site unspecified, unspecified as acute or chronic, without hemorrhage or perforation: Secondary | ICD-10-CM | POA: Diagnosis not present

## 2021-03-23 DIAGNOSIS — K2921 Alcoholic gastritis with bleeding: Secondary | ICD-10-CM | POA: Diagnosis not present

## 2021-03-23 LAB — COMPREHENSIVE METABOLIC PANEL
ALT: 23 U/L (ref 0–44)
AST: 30 U/L (ref 15–41)
Albumin: 3.9 g/dL (ref 3.5–5.0)
Alkaline Phosphatase: 67 U/L (ref 38–126)
Anion gap: 7 (ref 5–15)
BUN: 15 mg/dL (ref 6–20)
CO2: 26 mmol/L (ref 22–32)
Calcium: 8.1 mg/dL — ABNORMAL LOW (ref 8.9–10.3)
Chloride: 102 mmol/L (ref 98–111)
Creatinine, Ser: 0.97 mg/dL (ref 0.61–1.24)
GFR, Estimated: >60 mL/min (ref >60)
Glucose, Bld: 121 mg/dL — ABNORMAL HIGH (ref 70–99)
Potassium: 3.4 mmol/L — ABNORMAL LOW (ref 3.5–5.1)
Sodium: 135 mmol/L (ref 135–145)
Total Bilirubin: 1.6 mg/dL — ABNORMAL HIGH (ref 0.3–1.2)
Total Protein: 7.1 g/dL (ref 6.5–8.1)

## 2021-03-23 LAB — CBC
HCT: 41.6 % (ref 39.0–52.0)
Hemoglobin: 14.8 g/dL (ref 13.0–17.0)
MCH: 34.8 pg — ABNORMAL HIGH (ref 26.0–34.0)
MCHC: 35.6 g/dL (ref 30.0–36.0)
MCV: 97.9 fL (ref 80.0–100.0)
Platelets: 220 10*3/uL (ref 150–400)
RBC: 4.25 MIL/uL (ref 4.22–5.81)
RDW: 11.4 % — ABNORMAL LOW (ref 11.5–15.5)
WBC: 12.3 10*3/uL — ABNORMAL HIGH (ref 4.0–10.5)
nRBC: 0 % (ref 0.0–0.2)

## 2021-03-23 LAB — TYPE AND SCREEN
ABO/RH(D): O POS
Antibody Screen: NEGATIVE

## 2021-03-23 LAB — LIPASE, BLOOD: Lipase: 40 U/L (ref 11–51)

## 2021-03-23 MED ORDER — FAMOTIDINE IN NACL 20-0.9 MG/50ML-% IV SOLN
20.0000 mg | Freq: Once | INTRAVENOUS | Status: AC
  Administered 2021-03-23: 20 mg via INTRAVENOUS
  Filled 2021-03-23: qty 50

## 2021-03-23 MED ORDER — IOHEXOL 350 MG/ML SOLN
100.0000 mL | Freq: Once | INTRAVENOUS | Status: AC | PRN
  Administered 2021-03-23: 100 mL via INTRAVENOUS

## 2021-03-23 MED ORDER — SODIUM CHLORIDE 0.9 % IV BOLUS
1000.0000 mL | Freq: Once | INTRAVENOUS | Status: AC
  Administered 2021-03-23: 1000 mL via INTRAVENOUS

## 2021-03-23 MED ORDER — FAMOTIDINE 20 MG PO TABS: 20.0000 mg | ORAL_TABLET | Freq: Two times a day (BID) | ORAL | 0 refills | Status: AC

## 2021-03-23 MED ORDER — ALUM & MAG HYDROXIDE-SIMETH 200-200-20 MG/5ML PO SUSP
30.0000 mL | Freq: Once | ORAL | Status: AC
  Administered 2021-03-23: 30 mL via ORAL
  Filled 2021-03-23: qty 30

## 2021-03-23 MED ORDER — LIDOCAINE VISCOUS HCL 2 % MT SOLN
15.0000 mL | Freq: Once | OROMUCOSAL | Status: AC
  Administered 2021-03-23: 15 mL via ORAL
  Filled 2021-03-23: qty 15

## 2021-03-23 MED ORDER — SUCRALFATE 1 GM/10ML PO SUSP
1.0000 g | Freq: Four times a day (QID) | ORAL | 1 refills | Status: AC
Start: 2021-03-23 — End: ?

## 2021-03-23 NOTE — Discharge Instructions (Addendum)
1.  Take Pepcid 20 mg twice daily (#60). 2.  Take Carafate 3 times daily with food and at bedtime. 3.  Avoid heavy, spicy, greasy foods and alcohol. 4.  Return to the ER for worsening symptoms, persistent vomiting, difficulty breathing or other concerns.

## 2021-03-23 NOTE — ED Provider Notes (Signed)
[facility] Emergency Department Provider Note   ____________________________________________   Event Date/Time   First MD Initiated Contact with Patient 03/23/21 [id number]     (approximate)  I have reviewed the triage vital signs and the nursing notes.   HISTORY  Chief Complaint Abdominal Pain and Hematemesis    HPI Carlos Collier is a 35 y.o. male who presents to the ED from home with a chief complaint of hematemesis, black tarry stool, epigastric abdominal pain.  Patient was seen in the ED for similar last month but did not pick up his prescriptions for Pepcid and Carafate.  Reports multiple life stressors and drinking alcohol more than usual.  Drank heavily several days ago and subsequently developed epigastric abdominal discomfort, occasional hematemesis and black tarry stools.  Denies fever, cough, chest pain, shortness of breath.  Denies anticoagulant use.     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  Prior to Admission medications   Medication Sig Start Date End Date Taking? Authorizing Provider  sucralfate (CARAFATE) 1 GM/10ML suspension Take 10 mLs (1 g total) by mouth 4 (four) times daily. 03/23/21  Yes Irean Hong, MD  cholecalciferol (VITAMIN D) 1000 UNITS tablet Take 1,000 Units by mouth daily. Patient not taking: Reported on 12/06/2020    [provider]  famotidine (PEPCID) 20 MG tablet Take 1 tablet (20 mg total) by mouth 2 (two) times daily. 03/23/21   Irean Hong, MD  ferrous sulfate 325 (65 FE) MG tablet Take 325 mg by mouth daily with breakfast. Patient not taking: Reported on 12/06/2020    [provider]  metoCLOPramide (REGLAN) 10 MG tablet Take 1 tablet (10 mg total) by mouth every 6 (six) hours as needed. 02/20/21   Sharman Cheek, MD  traMADol (ULTRAM) 50 MG tablet Take 1 tablet (50 mg total) by mouth  every 6 (six) hours as needed. Patient not taking: Reported on 12/06/2020 10/17/20   Tommi Rumps, PA-C  vitamin C (ASCORBIC ACID) 500 MG tablet Take 500 mg by mouth daily. Patient not taking: Reported on 12/06/2020    [provider]    Allergies Aspirin, Other, and Shellfish allergy  No family history on file.  Social History Social History   Tobacco Use   Smoking status: Every Day    Packs/day: 0.25    Years: 5.00    Pack years: 1.25    Types: Cigarettes   Smokeless tobacco: Never  Vaping Use   Vaping Use: Never used  Substance Use Topics   Alcohol use: Yes    Alcohol/week: 5.0 standard drinks    Types: 5 Cans of beer per week   Drug use: Yes    Types: Marijuana    Review of Systems  Constitutional: No fever/chills Eyes: No visual changes. ENT: No sore throat. Cardiovascular: Denies chest pain. Respiratory: Denies shortness of breath. Gastrointestinal: Positive for epigastric abdominal pain and occasional hematemesis.  Positive for black tarry stools. No diarrhea.  No constipation. Genitourinary: Negative for dysuria. Musculoskeletal: Negative for back pain. Skin: Negative for rash. Neurological: Negative for headaches, focal weakness or numbness.   ____________________________________________   PHYSICAL EXAM:  VITAL SIGNS: ED Triage Vitals [03/23/21 0253]  Enc Vitals Group     BP (!) 150/89     Pulse Rate (!) 52     Resp 16     Temp 98  F (36.7 C)     Temp Source Oral     SpO2 98 %     Weight 200 lb (90.7 kg)     Height 5\' 9"  (1.753 m)     Head Circumference      Peak Flow      Pain Score      Pain Loc      Pain Edu?      Excl. in GC?     Constitutional: Alert and oriented. Well appearing and in no acute distress. Eyes: Conjunctivae are normal. PERRL. EOMI. Head: Atraumatic. Nose: No congestion/rhinnorhea. Mouth/Throat: Mucous membranes are moist.   Neck: No stridor.   Cardiovascular: Normal rate, regular rhythm. Grossly normal  heart sounds.  Good peripheral circulation. Respiratory: Normal respiratory effort.  No retractions. Lungs CTAB. Gastrointestinal: Soft and mildly tender to palpation epigastrium without rebound or guarding. No distention. No abdominal bruits. No CVA tenderness. Musculoskeletal: No lower extremity tenderness nor edema.  No joint effusions. Neurologic:  Normal speech and language. No gross focal neurologic deficits are appreciated. No gait instability. Skin:  Skin is warm, dry and intact. No rash noted. Psychiatric: Mood and affect are normal. Speech and behavior are normal.  ____________________________________________   LABS (all labs ordered are listed, but only abnormal results are displayed)  Labs Reviewed  COMPREHENSIVE METABOLIC PANEL - Abnormal; Notable for the following components:      Result Value   Potassium 3.4 (*)    Glucose, Bld 121 (*)    Calcium 8.1 (*)    Total Bilirubin 1.6 (*)    All other components within normal limits  CBC - Abnormal; Notable for the following components:   WBC 12.3 (*)    MCH 34.8 (*)    RDW 11.4 (*)    All other components within normal limits  LIPASE, BLOOD  TYPE AND SCREEN   ____________________________________________  EKG  None ____________________________________________  RADIOLOGY I, Jiah Bari J, personally viewed and evaluated these images (plain radiographs) as part of my medical decision making, as well as reviewing the written report by the radiologist.  ED MD interpretation: CTA abdomen/pelvis without acute abnormalities  Official radiology report(s): CT Angio Abd/Pel w/ and/or w/o  Result Date: 03/23/2021 CLINICAL DATA:  Black tarry stool and hematemesis.  Abdominal pain EXAM: CTA ABDOMEN AND PELVIS WITHOUT AND WITH CONTRAST TECHNIQUE: Multidetector CT imaging of the abdomen and pelvis was performed using the standard protocol during bolus administration of intravenous contrast. Multiplanar reconstructed images and MIPs  were obtained and reviewed to evaluate the vascular anatomy. CONTRAST:  03/25/2021 OMNIPAQUE IOHEXOL 350 MG/ML SOLN COMPARISON:  None. FINDINGS: VASCULAR Aorta: Normal Celiac: Normal SMA: Normal Renals: Vessels are smooth and widely patent. Negative for aneurysm. IMA: Normal Inflow: Normal Proximal Outflow: Normal Veins: Unremarkable Review of the MIP images confirms the above findings. NON-VASCULAR Lower chest:  No contributory findings. Hepatobiliary: No focal liver abnormality. No evidence of cirrhosis. No evidence of biliary obstruction or stone. Pancreas: Unremarkable. Spleen: Unremarkable. Adrenals/Urinary Tract: Negative adrenals. No hydronephrosis or stone. Unremarkable bladder. Stomach/Bowel:  No obstruction. No visible inflammation or mass. Vascular/Lymphatic: No acute vascular abnormality. No mass or adenopathy. Reproductive:Negative Other: No ascites or pneumoperitoneum. Musculoskeletal: No acute abnormalities. IMPRESSION: Normal study.  No visible source of bleeding. Electronically Signed   By: M.D.   On: 03/23/2021 04:45    ____________________________________________   PROCEDURES  Procedure(s) performed (including Critical Care):  Procedures  Rectal exam: External exam unremarkable without hemorrhoids or fissures.  Tan stool  on gloved finger which is heme-negative.  ____________________________________________   INITIAL IMPRESSION / ASSESSMENT AND PLAN / ED COURSE  As part of my medical decision making, I reviewed the following data within the electronic MEDICAL RECORD NUMBER Nursing notes reviewed and incorporated, Labs reviewed, Old chart reviewed, Radiograph reviewed, and Notes from prior ED visits     34 year old male presenting with epigastric pain, occasional hematemesis and black tarry stools. Differential diagnosis includes, but is not limited to, biliary disease (biliary colic, acute cholecystitis, cholangitis, choledocholithiasis, etc), intrathoracic causes for  epigastric abdominal pain including ACS, gastritis, duodenitis, pancreatitis, small bowel or large bowel obstruction, abdominal aortic aneurysm, hernia, and ulcer(s).   Laboratory and CTA abdomen/pelvis results unremarkable.  Patient is guaiac negative on examination.  Suspect alcoholic gastritis and likely PUD.  Encourage patient to take Pepcid and Carafate; will refill prescriptions.  Will refer to GI for outpatient follow-up.  Will administer IV fluids, IV Pepcid, GI cocktail.  Return precautions given.  Patient verbalizes understanding agrees with plan of care.      ____________________________________________   FINAL CLINICAL IMPRESSION(S) / ED DIAGNOSES  Final diagnoses:  Epigastric pain  Acute alcoholic gastritis with hemorrhage  PUD (peptic ulcer disease)     ED Discharge Orders          Ordered    famotidine (PEPCID) 20 MG tablet  2 times daily        03/23/21 0554    sucralfate (CARAFATE) 1 GM/10ML suspension  4 times daily        03/23/21 0554             Note:  This document was prepared using Dragon voice recognition software and may include unintentional dictation errors.    Irean Hong, MD 03/23/21 [phone]

## 2021-03-23 NOTE — ED Notes (Signed)
Abd pain since Wednesday, dark stools, 139/86 cbg 127 98%RA  Hx GcPD  4mg  sofran  IV right AC  20

## 2021-03-23 NOTE — ED Triage Notes (Signed)
Patient reports black tarry stool, blood in his vomit, abdominal pain, and diaphoresis. Patient reports abdominal pain is mainly in bilateral upper quadrants.

## 2021-05-17 ENCOUNTER — Emergency Department
Admission: EM | Admit: 2021-05-17 | Discharge: 2021-05-17 | Disposition: A | Discharge status: Home / Self Care | Payer: BC Managed Care – PPO | Attending: Emergency Medicine | Admitting: Emergency Medicine

## 2021-05-17 ENCOUNTER — Other Ambulatory Visit: Payer: Self-pay

## 2021-05-17 DIAGNOSIS — F17210 Nicotine dependence, cigarettes, uncomplicated: Secondary | ICD-10-CM | POA: Insufficient documentation

## 2021-05-17 DIAGNOSIS — Z20822 Contact with and (suspected) exposure to covid-19: Secondary | ICD-10-CM | POA: Diagnosis not present

## 2021-05-17 DIAGNOSIS — J020 Streptococcal pharyngitis: Secondary | ICD-10-CM | POA: Diagnosis not present

## 2021-05-17 DIAGNOSIS — J019 Acute sinusitis, unspecified: Secondary | ICD-10-CM | POA: Insufficient documentation

## 2021-05-17 DIAGNOSIS — R0981 Nasal congestion: Secondary | ICD-10-CM | POA: Diagnosis present

## 2021-05-17 LAB — RESP PANEL BY RT-PCR (FLU A&B, COVID) ARPGX2
Influenza A by PCR: NEGATIVE
Influenza B by PCR: NEGATIVE
SARS Coronavirus 2 by RT PCR: NEGATIVE

## 2021-05-17 MED ORDER — AMOXICILLIN-POT CLAVULANATE 875-125 MG PO TABS: 1.0000 | ORAL_TABLET | Freq: Two times a day (BID) | ORAL | 0 refills | Status: AC

## 2021-05-17 MED ORDER — ACETAMINOPHEN 500 MG PO TABS
1000.0000 mg | ORAL_TABLET | Freq: Once | ORAL | Status: AC
  Administered 2021-05-17: 1000 mg via ORAL
  Filled 2021-05-17: qty 2

## 2021-05-17 MED ORDER — AMOXICILLIN-POT CLAVULANATE 875-125 MG PO TABS: 1.0000 | ORAL_TABLET | Freq: Two times a day (BID) | ORAL | 0 refills | Status: DC

## 2021-05-17 MED ORDER — DEXAMETHASONE 1 MG/ML PO CONC
10.0000 mg | Freq: Once | ORAL | Status: AC
  Administered 2021-05-17: 10 mg via ORAL
  Filled 2021-05-17: qty 10

## 2021-05-17 NOTE — ED Notes (Signed)
See triage note  presents with low grade temp and sore throat  states sx's started on Sunday/Monday

## 2021-05-17 NOTE — Discharge Instructions (Addendum)
Return for pain more on 1 side of the mouth, not able to eat or drink or any other concerns

## 2021-05-17 NOTE — ED Triage Notes (Signed)
Pt reports that he has had a sinus infection and post nasal drip for the last 5 days. He has had taken OTC that are not helping. Pt is speaking in complete clear sentences.

## 2021-05-17 NOTE — ED Provider Notes (Signed)
[facility] Emergency Department Provider Note  ____________________________________________   Event Date/Time   First MD Initiated Contact with Patient 05/17/21 1330     (approximate)  I have reviewed the triage vital signs and the nursing notes.   HISTORY  Chief Complaint Facial Pain    HPI Carlos Collier is a 35 y.o. male with G6PD deficiency who comes in with concerns for strep throat and sinusitis.  Patient reports 5 days of congestion up in his sinuses with a lot of yellow mucousy discharge.  He also reports pain in his throat,.  Has a history of strep throat states that this feels very similar.  Pain is moderate, constant.  It is still able to swallow and drink.  Has had some low-grade fevers.  No cough.     Medical: G6PD  Patient Active Problem List   Diagnosis Date Noted   G6PD deficiency 05/18/2017    No past surgical history on file.  Prior to Admission medications   Medication Sig Start Date End Date Taking? Authorizing Provider  cholecalciferol (VITAMIN D) 1000 UNITS tablet Take 1,000 Units by mouth daily. Patient not taking: Reported on 12/06/2020    [provider]  famotidine (PEPCID) 20 MG tablet Take 1 tablet (20 mg total) by mouth 2 (two) times daily. 03/23/21   Irean Hong, MD  ferrous sulfate 325 (65 FE) MG tablet Take 325 mg by mouth daily with breakfast. Patient not taking: Reported on 12/06/2020    [provider]  metoCLOPramide (REGLAN) 10 MG tablet Take 1 tablet (10 mg total) by mouth every 6 (six) hours as needed. 02/20/21   Sharman Cheek, MD  sucralfate (CARAFATE) 1 GM/10ML suspension Take 10 mLs (1 g total) by mouth 4 (four) times daily. 03/23/21   Irean Hong, MD  traMADol (ULTRAM) 50 MG tablet Take 1 tablet (50 mg total) by mouth every 6 (six) hours as needed. Patient not taking: Reported on 12/06/2020 10/17/20   Tommi Rumps, PA-C  vitamin C (ASCORBIC ACID) 500 MG tablet Take 500 mg by mouth  daily. Patient not taking: Reported on 12/06/2020    [provider]    Allergies Aspirin, Other, and Shellfish allergy  No family history on file.  Social History Social History   Tobacco Use   Smoking status: Every Day    Packs/day: 0.25    Years: 5.00    Pack years: 1.25    Types: Cigarettes   Smokeless tobacco: Never  Vaping Use   Vaping Use: Never used  Substance Use Topics   Alcohol use: Yes    Alcohol/week: 5.0 standard drinks    Types: 5 Cans of beer per week   Drug use: Yes    Types: Marijuana      Review of Systems Constitutional: No fever/chills Eyes: No visual changes. ENT: Sore throat, congestion, facial pain, nasal drainage Cardiovascular: Denies chest pain. Respiratory: Denies shortness of breath. Gastrointestinal: No abdominal pain.  No nausea, no vomiting.  No diarrhea.  No constipation. Genitourinary: Negative for dysuria. Musculoskeletal: Negative for back pain. Skin: Negative for rash. Neurological: Negative for headaches, focal weakness or numbness. All other ROS negative ____________________________________________   PHYSICAL EXAM:  VITAL SIGNS: ED Triage Vitals  Enc Vitals Group     BP 05/17/21 1250 (!) 138/99     Pulse Rate 05/17/21 1250 97     Resp 05/17/21 1250 18     Temp 05/17/21 1250 99.9 F (37.7 C)  Temp Source 05/17/21 1250 Oral     SpO2 05/17/21 1250 99 %     Weight 05/17/21 1235 199 lb 15.3 oz (90.7 kg)     Height 05/17/21 1235 5\' 9"  (1.753 m)     Head Circumference --      Peak Flow --      Pain Score 05/17/21 1235 0     Pain Loc --      Pain Edu? --      Excl. in GC? --     Constitutional: Alert and oriented. Well appearing and in no acute distress. Eyes: Conjunctivae are normal. EOMI. Head: Atraumatic.  Sinus pressure Nose: No congestion/rhinnorhea. Mouth/Throat: Mucous membranes are moist.  Diffuse exudates noted on bilateral tonsils.  Uvula is midline.  Tonsils are equally enlarged. Neck: No  stridor. Trachea Midline. FROM Cardiovascular: Normal rate, regular rhythm. Grossly normal heart sounds.  Good peripheral circulation. Respiratory: Normal respiratory effort.  No retractions. Lungs CTAB. Gastrointestinal: Soft and nontender. No distention. No abdominal bruits.  Musculoskeletal: No lower extremity tenderness nor edema.  No joint effusions. Neurologic:  Normal speech and language. No gross focal neurologic deficits are appreciated.  Skin:  Skin is warm, dry and intact. No rash noted. Psychiatric: Mood and affect are normal. Speech and behavior are normal. GU: Deferred   ____________________________________________   INITIAL IMPRESSION / ASSESSMENT AND PLAN / ED COURSE  Carlos Collier was evaluated in Emergency Department on 05/17/2021 for the symptoms described in the history of present illness. He was evaluated in the context of the global COVID-19 pandemic, which necessitated consideration that the patient might be at risk for infection with the SARS-CoV-2 virus that causes COVID-19. Institutional protocols and algorithms that pertain to the evaluation of patients at risk for COVID-19 are in a state of rapid change based on information released by regulatory bodies including the CDC and federal and state organizations. These policies and algorithms were followed during the patient's care in the ED.    Patient comes in with sinus pressure, lots of nasal congestion and concerns for strep throat.  Given I am going to be treated for sinusitis regardless we will get him on Augmentin I do not feel we need to test him for strep.  He  has pus on his bilateral tonsils and I would treat him even if it was any negative.  Denies any high risk behaviors to suggest STDs.  We will get COVID test just to make sure is not viral that he would need to have special quarantine but did not need to wait for the results to come back.  Patient can follow-up in MyChart.  The no evidence of peritonsillar abscess  or RPA based upon examination         ____________________________________________   FINAL CLINICAL IMPRESSION(S) / ED DIAGNOSES   Final diagnoses:  Acute non-recurrent sinusitis, unspecified location  Strep throat      MEDICATIONS GIVEN DURING THIS VISIT:  Medications  acetaminophen (TYLENOL) tablet 1,000 mg (has no administration in time range)  dexamethasone (DECADRON) 1 MG/ML solution 10 mg (has no administration in time range)     ED Discharge Orders     None        Note:  This document was prepared using Dragon voice recognition software and may include unintentional dictation errors.    05/19/2021, MD 05/17/21 1344

## 2021-08-11 ENCOUNTER — Encounter: Payer: Self-pay | Admitting: Emergency Medicine

## 2021-08-11 ENCOUNTER — Emergency Department: Payer: Self-pay

## 2021-08-11 ENCOUNTER — Emergency Department
Admission: EM | Admit: 2021-08-11 | Discharge: 2021-08-11 | Disposition: A | Discharge status: Home / Self Care | Payer: Self-pay | Attending: Student in an Organized Health Care Education/Training Program | Admitting: Student in an Organized Health Care Education/Training Program

## 2021-08-11 ENCOUNTER — Other Ambulatory Visit: Payer: Self-pay

## 2021-08-11 DIAGNOSIS — F17200 Nicotine dependence, unspecified, uncomplicated: Secondary | ICD-10-CM | POA: Insufficient documentation

## 2021-08-11 DIAGNOSIS — Z20822 Contact with and (suspected) exposure to covid-19: Secondary | ICD-10-CM | POA: Insufficient documentation

## 2021-08-11 DIAGNOSIS — R002 Palpitations: Secondary | ICD-10-CM | POA: Insufficient documentation

## 2021-08-11 LAB — CBC
HCT: 45.2 % (ref 39.0–52.0)
Hemoglobin: 15.3 g/dL (ref 13.0–17.0)
MCH: 33.3 pg (ref 26.0–34.0)
MCHC: 33.8 g/dL (ref 30.0–36.0)
MCV: 98.3 fL (ref 80.0–100.0)
Platelets: 255 10*3/uL (ref 150–400)
RBC: 4.6 MIL/uL (ref 4.22–5.81)
RDW: 11.9 % (ref 11.5–15.5)
WBC: 9.9 10*3/uL (ref 4.0–10.5)
nRBC: 0 % (ref 0.0–0.2)

## 2021-08-11 LAB — BASIC METABOLIC PANEL
Anion gap: 9 (ref 5–15)
BUN: 6 mg/dL (ref 6–20)
CO2: 27 mmol/L (ref 22–32)
Calcium: 10 mg/dL (ref 8.9–10.3)
Chloride: 101 mmol/L (ref 98–111)
Creatinine, Ser: 0.86 mg/dL (ref 0.61–1.24)
GFR, Estimated: >60 mL/min (ref >60)
Glucose, Bld: 110 mg/dL — ABNORMAL HIGH (ref 70–99)
Potassium: 3.7 mmol/L (ref 3.5–5.1)
Sodium: 137 mmol/L (ref 135–145)

## 2021-08-11 LAB — TROPONIN I (HIGH SENSITIVITY): Troponin I (High Sensitivity): 3 ng/L (ref <18)

## 2021-08-11 LAB — RESP PANEL BY RT-PCR (FLU A&B, COVID) ARPGX2
Influenza A by PCR: NEGATIVE
Influenza B by PCR: NEGATIVE
SARS Coronavirus 2 by RT PCR: NEGATIVE
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
## 2021-08-11 NOTE — ED Notes (Signed)
Dc instructions reviewed with pt no questions or concerns at this time will follow up with pcp 
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
## 2021-08-25 NOTE — Progress Notes (Deleted)
NO SHOW

## 2021-08-26 ENCOUNTER — Ambulatory Visit: Payer: Self-pay | Admitting: Cardiovascular Disease

## 2021-08-27 ENCOUNTER — Encounter: Payer: Self-pay | Admitting: Cardiovascular Disease

## 2021-10-11 IMAGING — CT CT CTA ABD/PEL W/CM AND/OR W/O CM
3 of 10 series · 13 of 46 positions shown, 19 images · IV contrast (omnipaque)
Comparison: None.

CLINICAL DATA: Black tarry stool and hematemesis.  Abdominal pain

EXAM:
CTA ABDOMEN AND PELVIS WITHOUT AND WITH CONTRAST
TECHNIQUE: Multidetector CT imaging of the abdomen and pelvis was performed
using the standard protocol during bolus administration of
intravenous contrast. Multiplanar reconstructed images and MIPs were
obtained and reviewed to evaluate the vascular anatomy.
CONTRAST:  100mL OMNIPAQUE IOHEXOL 350 MG/ML SOLN

[Series 5: axial arterial · axial · arterial · 0.93mm/px · z∈[-981,-627]mm · 7 of 248 slices shown]
[im 18/248  soft-tissue]
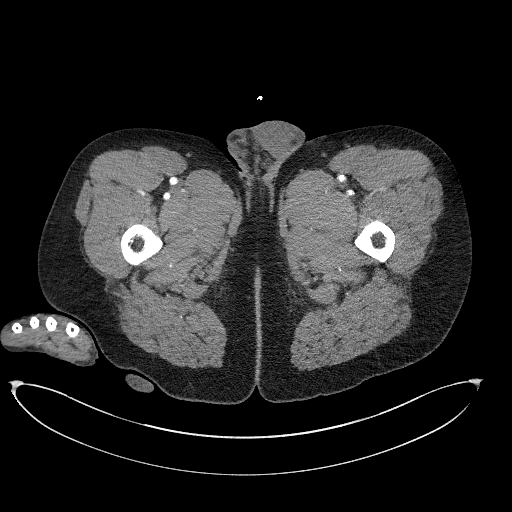
[im 53/248  soft-tissue]
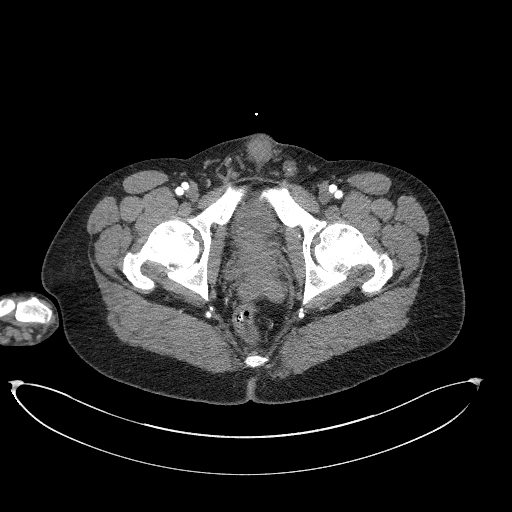
[im 89/248  soft-tissue]
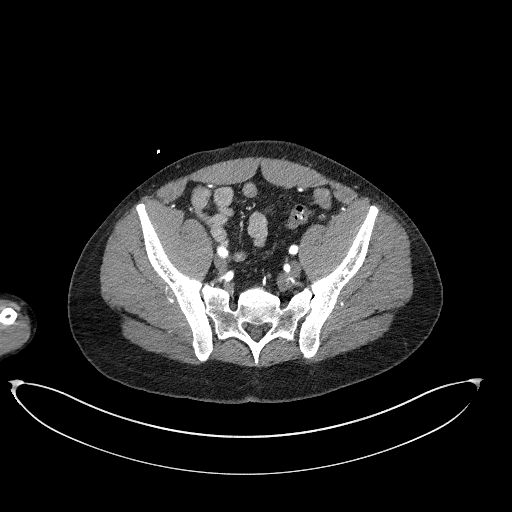
[im 106/248  soft-tissue]
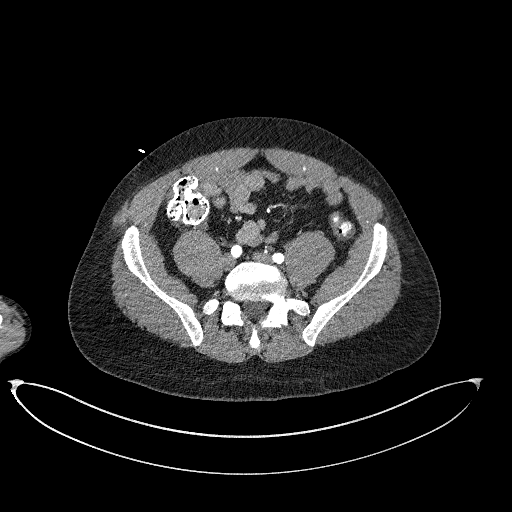
[im 142/248  soft-tissue]
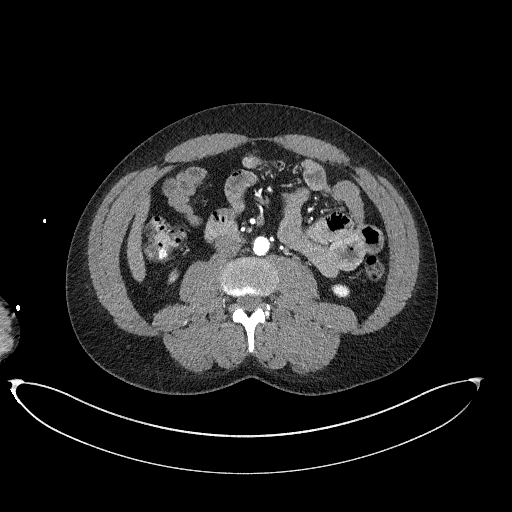
[im 159/248  soft-tissue]
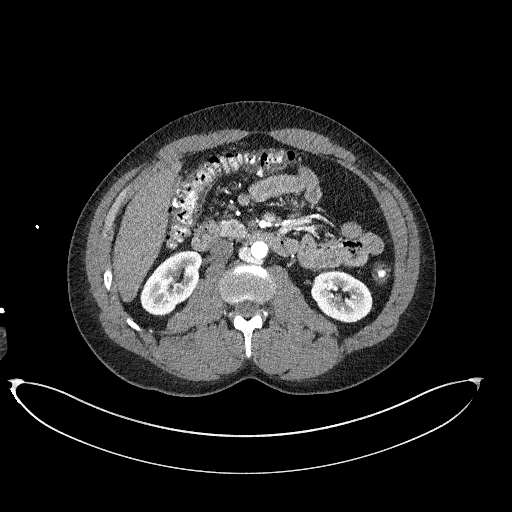
[im 195/248  soft-tissue]
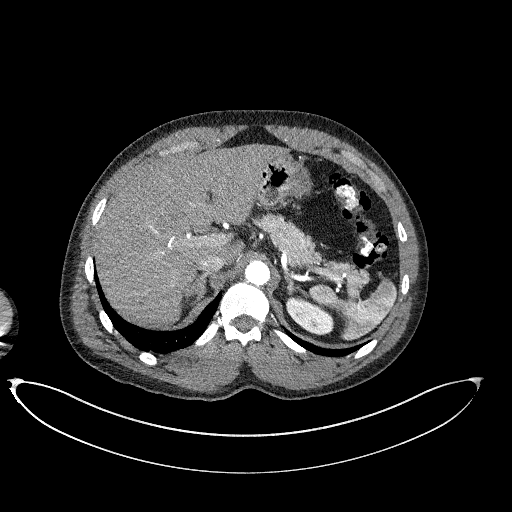

[Series 6: axial venous · axial · portal-venous · 0.93mm/px · z∈[-911,-621]mm · 4 of 98 slices shown, 9 images]
[im 20/98  soft-tissue]
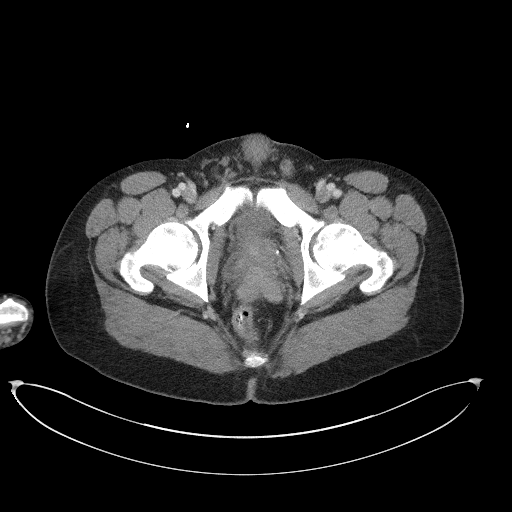
[im 20/98  lung]
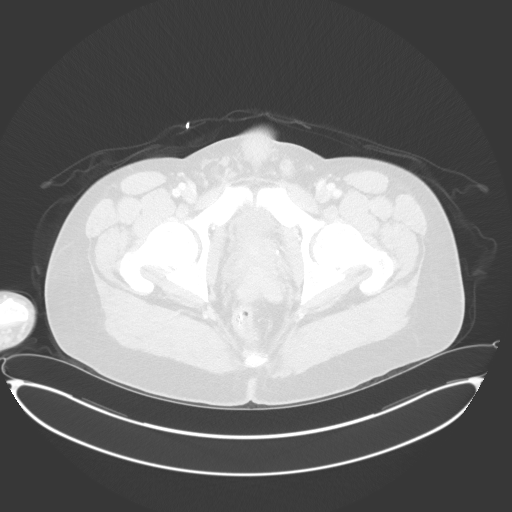
[im 20/98  bone]
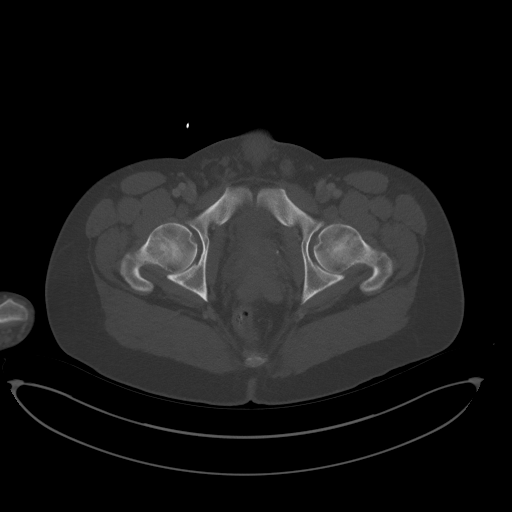
[im 39/98  soft-tissue]
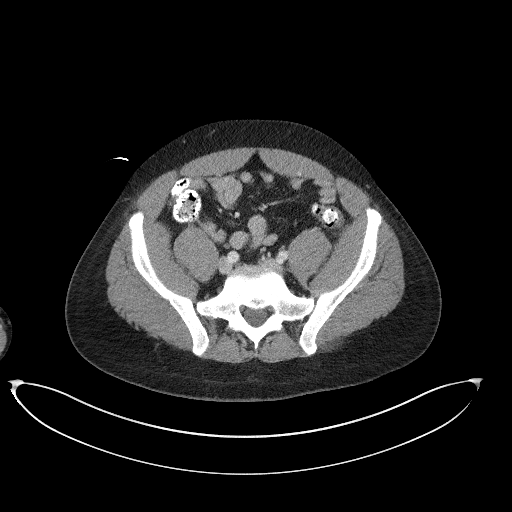
[im 39/98  lung]
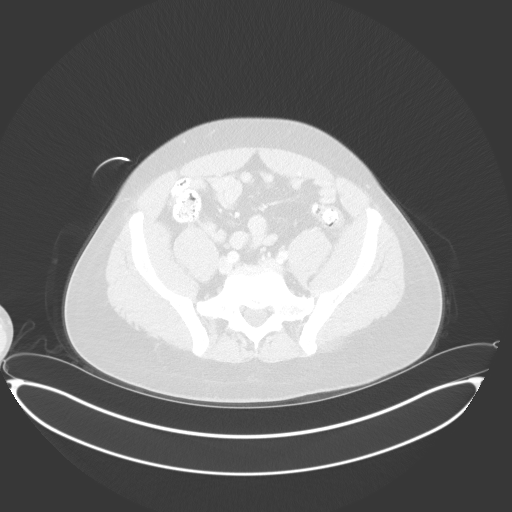
[im 59/98  soft-tissue]
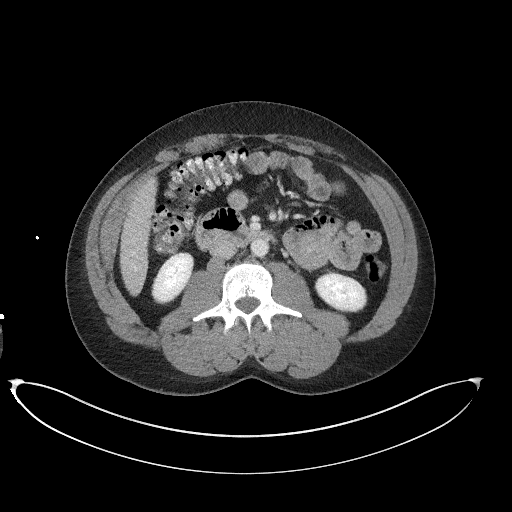
[im 59/98  lung]
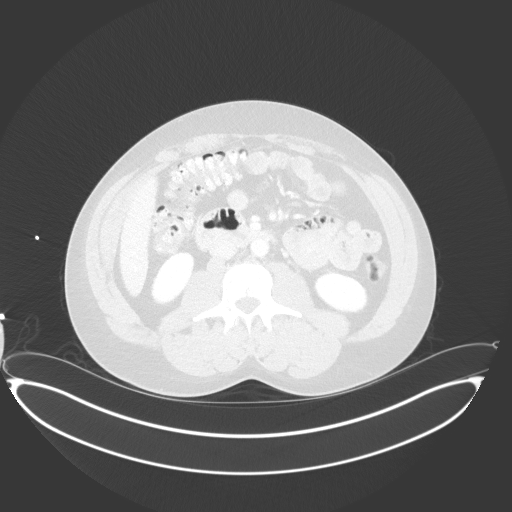
[im 78/98  soft-tissue]
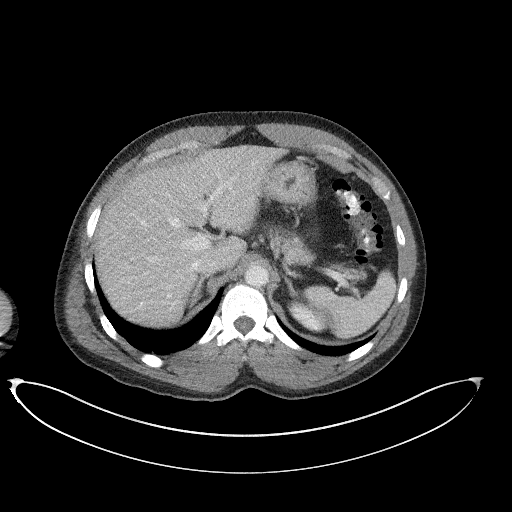
[im 78/98  lung]
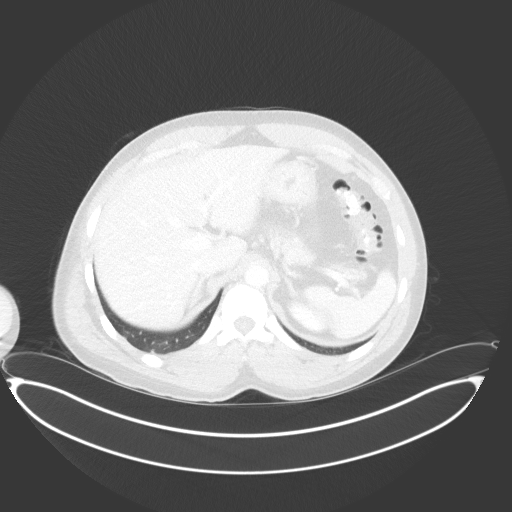

[Series 9: coronal mpr · coronal · 0.89mm/px · 2 of 143 slices shown, 3 images]
[im 48/143  soft-tissue]
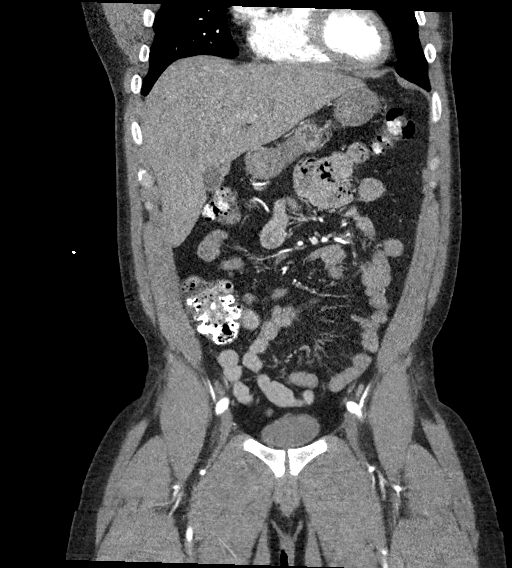
[im 48/143  bone]
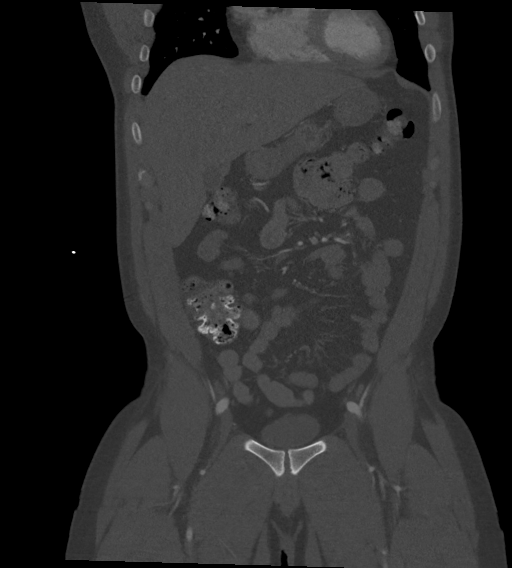
[im 95/143  soft-tissue]
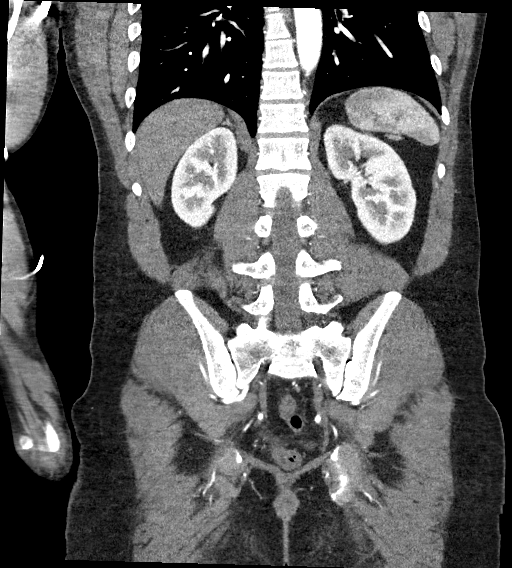

[13 of 46 positions shown; findings below may reference images not displayed]

FINDINGS: VASCULAR

Aorta: Normal

Celiac: Normal

SMA: Normal

Renals: Vessels are smooth and widely patent. Negative for aneurysm.

IMA: Normal

Inflow: Normal

Proximal Outflow: Normal

Veins: Unremarkable

Review of the MIP images confirms the above findings.

NON-VASCULAR

Lower chest:  No contributory findings.

Hepatobiliary: No focal liver abnormality. No evidence of cirrhosis.
No evidence of biliary obstruction or stone.

Pancreas: Unremarkable.

Spleen: Unremarkable.

Adrenals/Urinary Tract: Negative adrenals. No hydronephrosis or
stone. Unremarkable bladder.

Stomach/Bowel:  No obstruction. No visible inflammation or mass.

Vascular/Lymphatic: No acute vascular abnormality. No mass or
adenopathy.

Reproductive:Negative

Other: No ascites or pneumoperitoneum.

Musculoskeletal: No acute abnormalities.
IMPRESSION: Normal study.  No visible source of bleeding.

## 2022-03-01 IMAGING — CR DG CHEST 2V
1 series · 2 of 2 positions shown · non-contrast
Comparison: None.

CLINICAL DATA: Irregular heart rate.

EXAM:
CHEST - 2 VIEW

[Series 1: dg chest 2 view · 0.14mm/px · 2 of 2 slices shown]
[im 1/2]
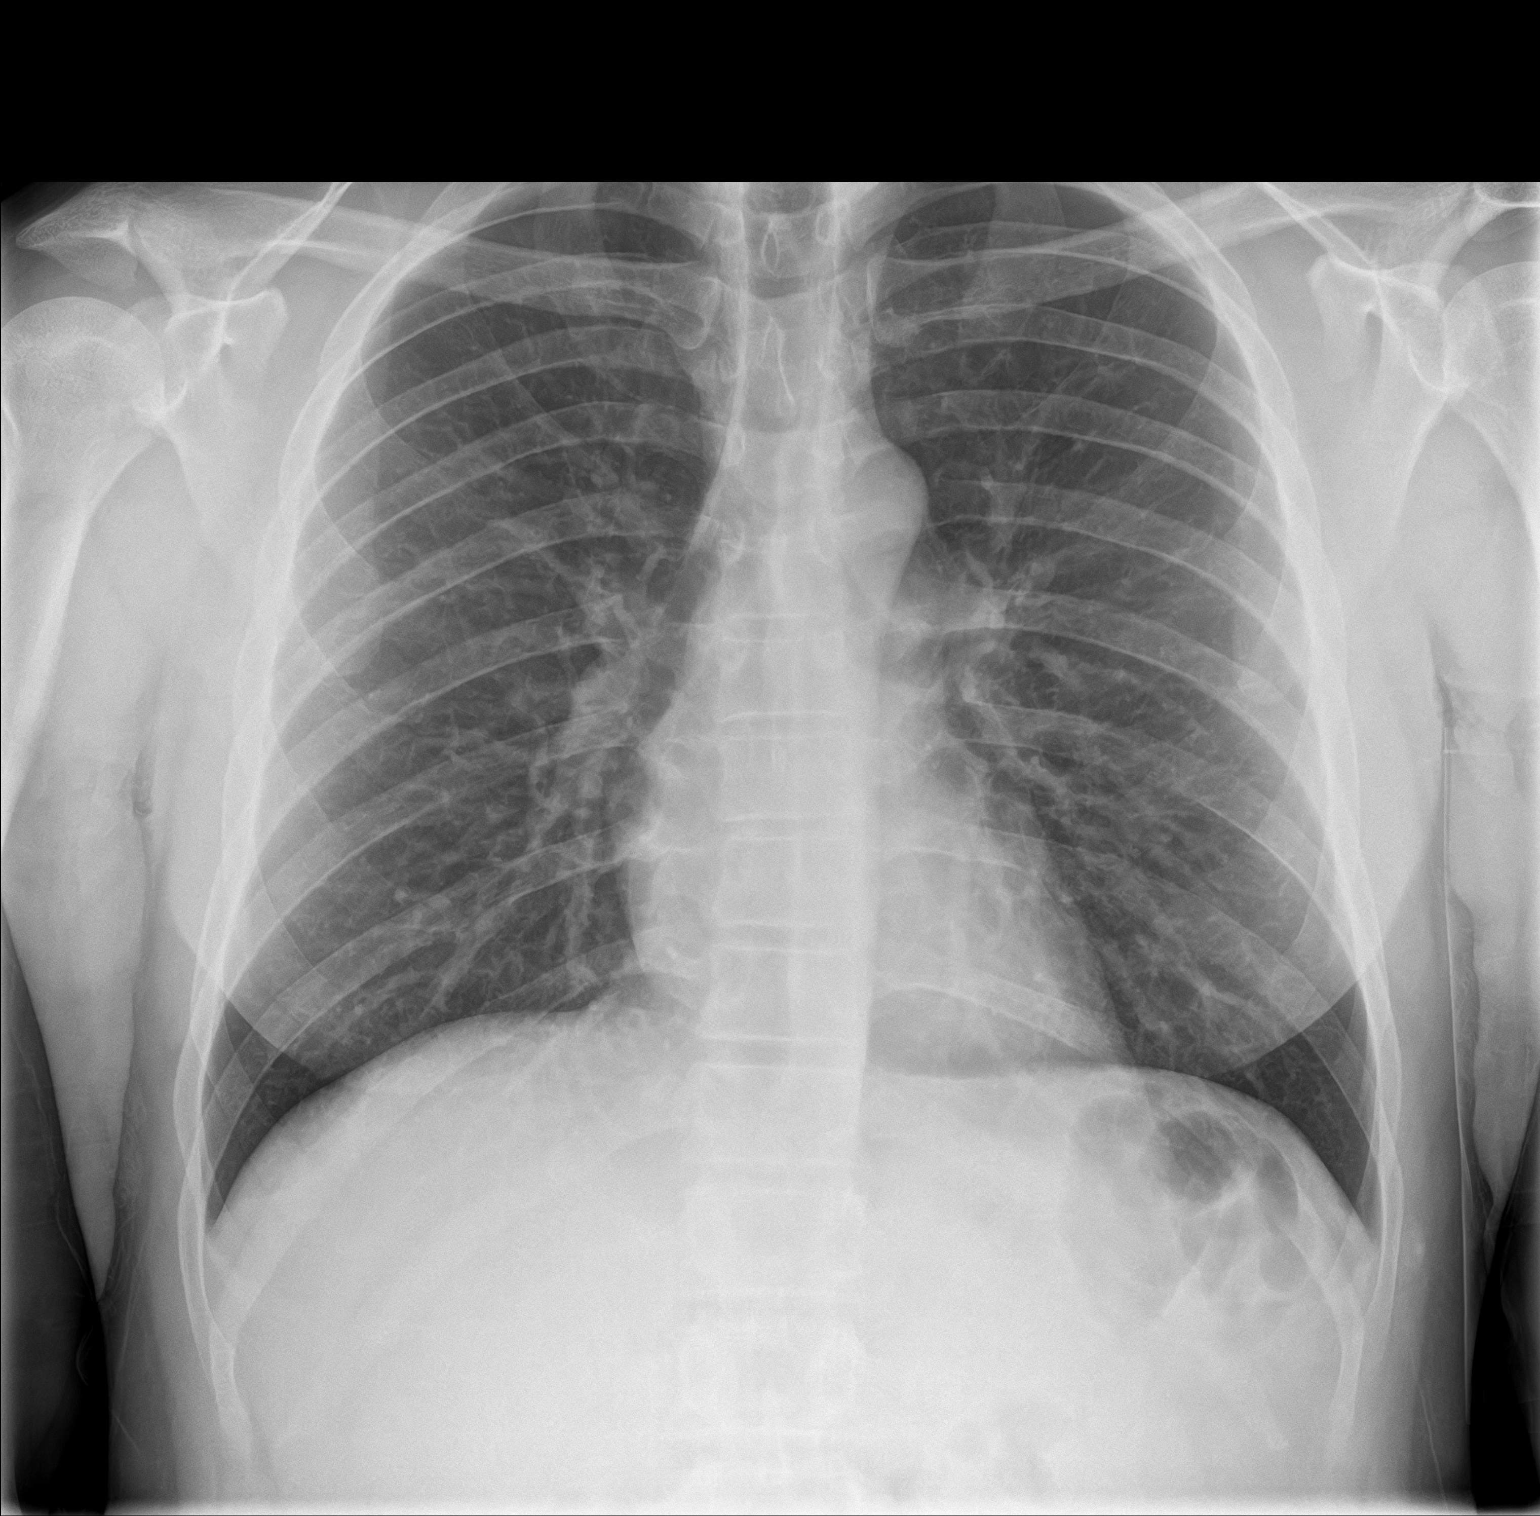
[im 2/2]
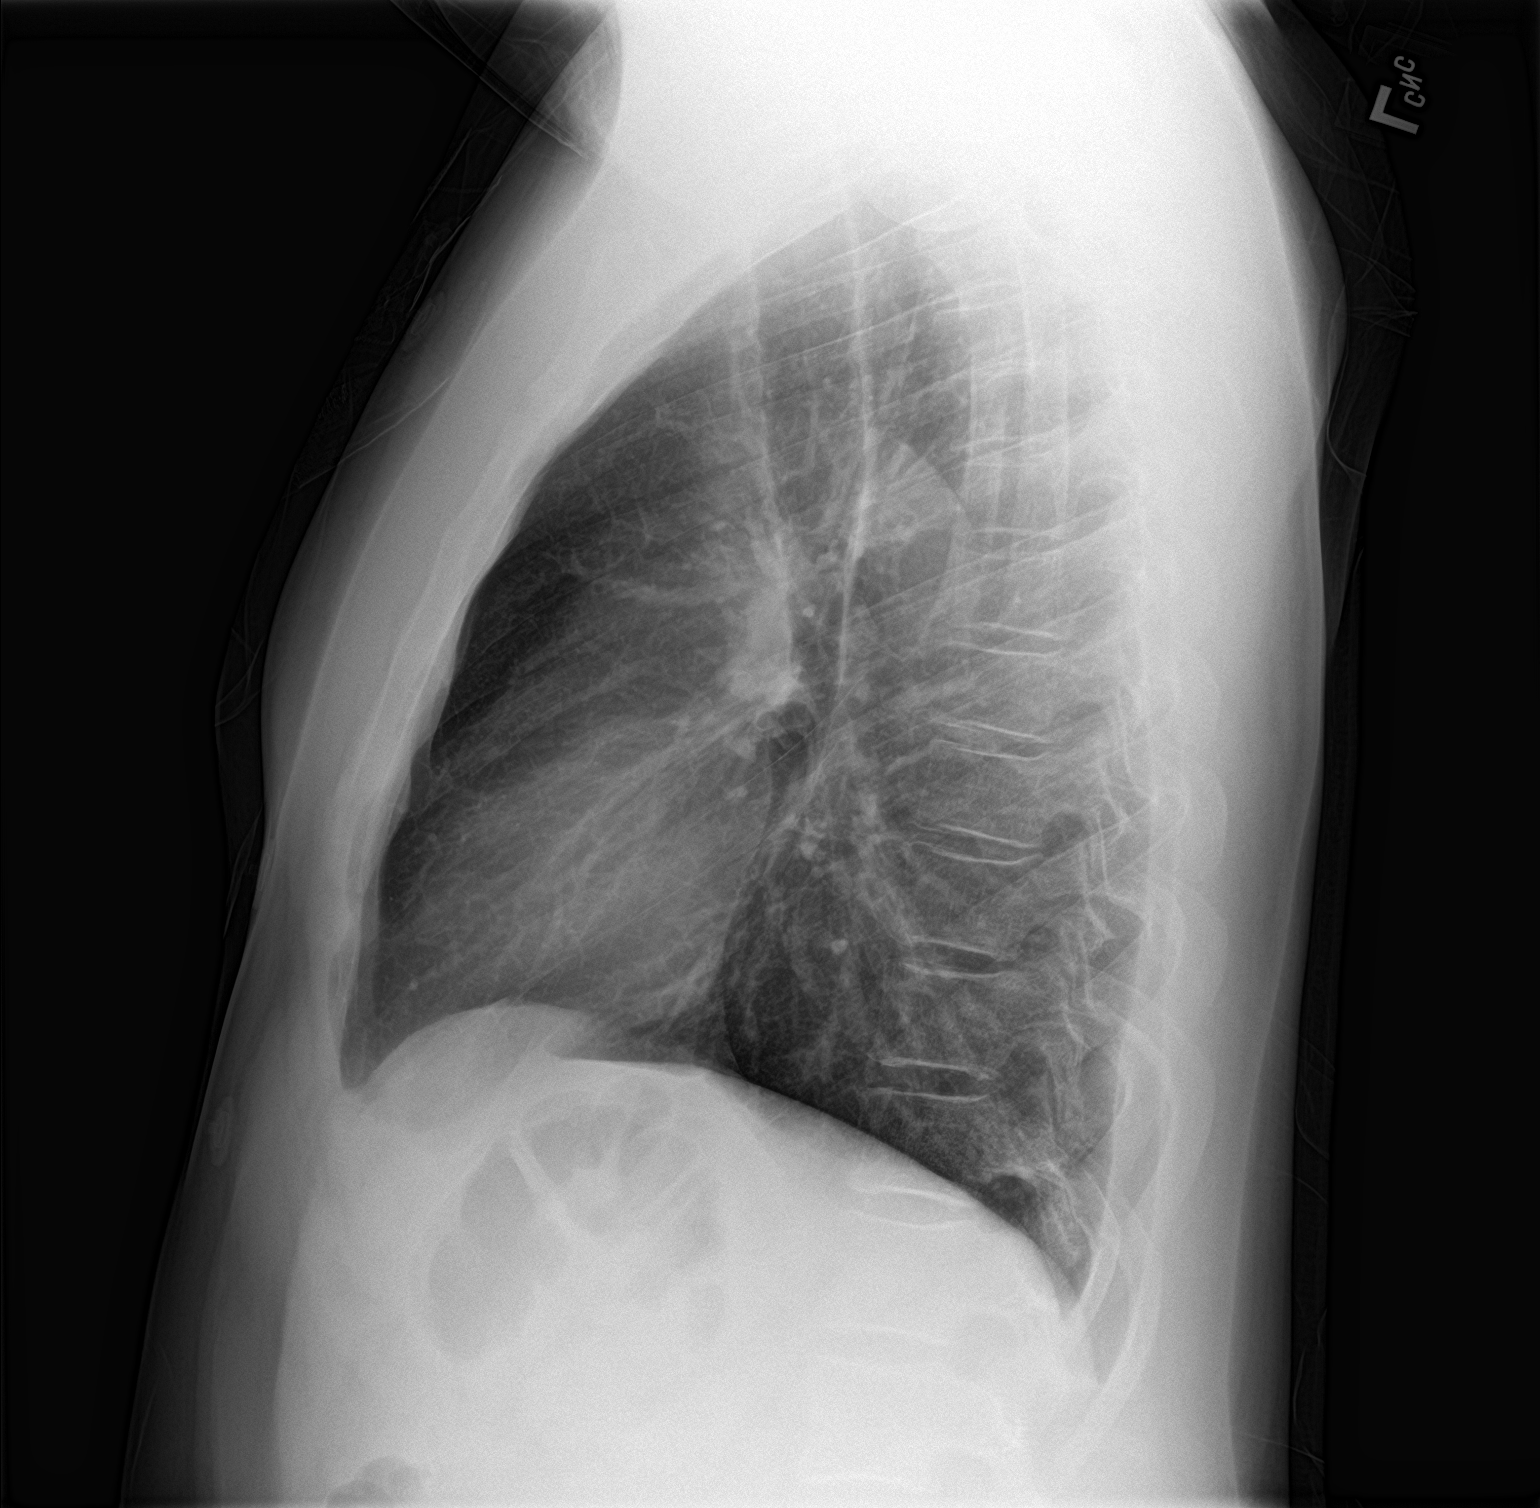

[2 of 2 positions shown; findings below may reference images not displayed]

FINDINGS: The heart size and mediastinal contours are within normal limits.
Both lungs are clear. The visualized skeletal structures are
unremarkable.
IMPRESSION: No active cardiopulmonary disease.

## 2022-05-23 ENCOUNTER — Emergency Department
Admission: EM | Admit: 2022-05-23 | Discharge: 2022-05-23 | Disposition: A | Discharge status: Home / Self Care | Payer: Self-pay | Attending: Emergency Medicine | Admitting: Emergency Medicine

## 2022-05-23 ENCOUNTER — Other Ambulatory Visit: Payer: Self-pay

## 2022-05-23 ENCOUNTER — Emergency Department: Payer: Self-pay

## 2022-05-23 DIAGNOSIS — R1110 Vomiting, unspecified: Secondary | ICD-10-CM | POA: Insufficient documentation

## 2022-05-23 DIAGNOSIS — F1090 Alcohol use, unspecified, uncomplicated: Secondary | ICD-10-CM

## 2022-05-23 DIAGNOSIS — F1020 Alcohol dependence, uncomplicated: Secondary | ICD-10-CM | POA: Insufficient documentation

## 2022-05-23 DIAGNOSIS — R1031 Right lower quadrant pain: Secondary | ICD-10-CM | POA: Insufficient documentation

## 2022-05-23 DIAGNOSIS — I10 Essential (primary) hypertension: Secondary | ICD-10-CM | POA: Insufficient documentation

## 2022-05-23 DIAGNOSIS — R1111 Vomiting without nausea: Secondary | ICD-10-CM

## 2022-05-23 LAB — CBC
HCT: 46.3 % (ref 39.0–52.0)
Hemoglobin: 16 g/dL (ref 13.0–17.0)
MCH: 34 pg (ref 26.0–34.0)
MCHC: 34.6 g/dL (ref 30.0–36.0)
MCV: 98.5 fL (ref 80.0–100.0)
Platelets: 258 10*3/uL (ref 150–400)
RBC: 4.7 MIL/uL (ref 4.22–5.81)
RDW: 11.9 % (ref 11.5–15.5)
WBC: 10.1 10*3/uL (ref 4.0–10.5)
nRBC: 0 % (ref 0.0–0.2)

## 2022-05-23 LAB — COMPREHENSIVE METABOLIC PANEL
ALT: 25 U/L (ref 0–44)
AST: 31 U/L (ref 15–41)
Albumin: 4.3 g/dL (ref 3.5–5.0)
Alkaline Phosphatase: 72 U/L (ref 38–126)
Anion gap: 12 (ref 5–15)
BUN: 7 mg/dL (ref 6–20)
CO2: 24 mmol/L (ref 22–32)
Calcium: 9.5 mg/dL (ref 8.9–10.3)
Chloride: 102 mmol/L (ref 98–111)
Creatinine, Ser: 0.84 mg/dL (ref 0.61–1.24)
GFR, Estimated: >60 mL/min (ref >60)
Glucose, Bld: 119 mg/dL — ABNORMAL HIGH (ref 70–99)
Potassium: 4.1 mmol/L (ref 3.5–5.1)
Sodium: 138 mmol/L (ref 135–145)
Total Bilirubin: 1.3 mg/dL — ABNORMAL HIGH (ref 0.3–1.2)
Total Protein: 8.2 g/dL — ABNORMAL HIGH (ref 6.5–8.1)

## 2022-05-23 LAB — URINALYSIS, ROUTINE W REFLEX MICROSCOPIC
Bilirubin Urine: NEGATIVE
Glucose, UA: NEGATIVE mg/dL
Hgb urine dipstick: NEGATIVE
Ketones, ur: NEGATIVE mg/dL
Leukocytes,Ua: NEGATIVE
Nitrite: NEGATIVE
Protein, ur: NEGATIVE mg/dL
Specific Gravity, Urine: 1.002 — ABNORMAL LOW (ref 1.005–1.030)
pH: 7 (ref 5.0–8.0)

## 2022-05-23 LAB — CBG MONITORING, ED: Glucose-Capillary: 114 mg/dL — ABNORMAL HIGH (ref 70–99)

## 2022-05-23 LAB — LIPASE, BLOOD: Lipase: 29 U/L (ref 11–51)

## 2022-05-23 MED ORDER — THIAMINE HCL 100 MG/ML IJ SOLN
100.0000 mg | Freq: Once | INTRAMUSCULAR | Status: AC
  Administered 2022-05-23: 100 mg via INTRAVENOUS
  Filled 2022-05-23: qty 2

## 2022-05-23 MED ORDER — LACTATED RINGERS IV BOLUS
1000.0000 mL | Freq: Once | INTRAVENOUS | Status: AC
  Administered 2022-05-23: 1000 mL via INTRAVENOUS

## 2022-05-23 MED ORDER — ONDANSETRON 4 MG PO TBDP
4.0000 mg | ORAL_TABLET | Freq: Once | ORAL | Status: AC | PRN
  Administered 2022-05-23: 4 mg via ORAL
  Filled 2022-05-23: qty 1

## 2022-05-23 MED ORDER — THIAMINE HCL 100 MG PO TABS
100.0000 mg | ORAL_TABLET | Freq: Every day | ORAL | 0 refills | Status: AC
Start: 2022-05-23 — End: 2022-06-22

## 2022-05-23 MED ORDER — PANTOPRAZOLE SODIUM 40 MG PO TBEC
40.0000 mg | DELAYED_RELEASE_TABLET | Freq: Every day | ORAL | 0 refills | Status: AC
Start: 2022-05-23 — End: 2022-06-22

## 2022-05-23 MED ORDER — DROPERIDOL 2.5 MG/ML IJ SOLN
2.5000 mg | Freq: Once | INTRAMUSCULAR | Status: AC
  Administered 2022-05-23: 2.5 mg via INTRAVENOUS
  Filled 2022-05-23: qty 2

## 2022-05-23 MED ORDER — PANTOPRAZOLE SODIUM 40 MG IV SOLR
40.0000 mg | Freq: Once | INTRAVENOUS | Status: AC
  Administered 2022-05-23: 40 mg via INTRAVENOUS
  Filled 2022-05-23: qty 10

## 2022-05-23 MED ORDER — CHLORDIAZEPOXIDE HCL 25 MG PO CAPS: ORAL_CAPSULE | ORAL | 0 refills | Status: AC

## 2022-05-23 NOTE — ED Notes (Signed)
Consult done with Lilia Pro, pharmacist, about allergies and possible interactions with G6PD deficiency.

## 2022-05-23 NOTE — Discharge Instructions (Addendum)
Please start taking the thiamine daily.  You can start taking the Protonix for acid reduction.  Please take the Librium as a taper over the next 4 days to prevent withdrawal and help you stop drinking.  If you are going to restart drinking please do not take the Librium on top of this as it can be dangerous.  Please follow-up with [facility] for additional resources for alcohol cessation.  Please follow-up with your primary care doctor regarding your nausea and vomiting.
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
## 2023-01-27 ENCOUNTER — Other Ambulatory Visit: Payer: Self-pay

## 2023-01-27 ENCOUNTER — Encounter: Payer: Self-pay | Admitting: Emergency Medicine

## 2023-01-27 ENCOUNTER — Emergency Department
Admission: EM | Admit: 2023-01-27 | Discharge: 2023-01-27 | Disposition: A | Discharge status: Home / Self Care | Payer: Self-pay | Attending: Emergency Medicine | Admitting: Emergency Medicine

## 2023-01-27 DIAGNOSIS — H209 Unspecified iridocyclitis: Secondary | ICD-10-CM | POA: Insufficient documentation

## 2023-01-27 MED ORDER — TETRACAINE HCL 0.5 % OP SOLN
2.0000 [drp] | Freq: Once | OPHTHALMIC | Status: AC
  Administered 2023-01-27: 2 [drp] via OPHTHALMIC
  Filled 2023-01-27: qty 4

## 2023-01-27 MED ORDER — PREDNISOLONE ACETATE 1 % OP SUSP: 1.0000 [drp] | Freq: Four times a day (QID) | OPHTHALMIC | 0 refills | Status: AC

## 2023-01-27 MED ORDER — ACETAMINOPHEN 500 MG PO TABS
ORAL_TABLET | ORAL | Status: AC
  Filled 2023-01-27: qty 2

## 2023-01-27 MED ORDER — FLUORESCEIN SODIUM 1 MG OP STRP
1.0000 | ORAL_STRIP | Freq: Once | OPHTHALMIC | Status: AC
  Administered 2023-01-27: 1 via OPHTHALMIC
  Filled 2023-01-27: qty 1

## 2023-01-27 MED ORDER — ACETAMINOPHEN 500 MG PO TABS
1000.0000 mg | ORAL_TABLET | Freq: Once | ORAL | Status: AC
  Administered 2023-01-27: 1000 mg via ORAL

## 2023-01-27 NOTE — ED Triage Notes (Signed)
Patient to ED via POV for right eye pain. Patient states he feels like something has been in his eye since Wednesday. Eye red and irritated.

## 2023-01-27 NOTE — ED Notes (Signed)
See triage note  Presents with pain to right eye   States  he was stuck in the eye last week  Cont's to have pain

## 2023-01-27 NOTE — Discharge Instructions (Signed)
Start using the prednisone drops 4 times daily as prescribed.  Follow-up with the eye doctor tomorrow.  You can either call first thing in the morning or show up at the office and let them know that you were seen in the ER and that Dr. Inez Pilgrim wanted you seen in the office.  Return to the ER for any new or worsening pain, swelling, headache, blurred vision, or any other new or worsening symptoms that concern you.

## 2023-01-27 NOTE — ED Provider Notes (Signed)
[facility] Provider Note    Event Date/Time   First MD Initiated Contact with Patient 01/27/23 1323     (approximate)   History   Eye Pain   HPI  Carlos Collier is a 37 y.o. male with no active medical problems, not on any medications who presents with right eye pain for the last 5 days.  The patient states that he was playing some sports, fell, and someone's hand went into his eye.  Since that time he has had foreign body sensation, pain to the eye and to the temple, blurred vision, clear discharge, and photophobia.  He denies any symptoms in the left eye.  He also reports an area of tingling or numbness on the right lower part of his face below the lower lips that he believes might be a chemical burn.  He denies any other numbness or tingling in the rest of his face or any weakness or numbness in the arms or legs.  I reviewed the past medical records.  The patient has a few prior ED visits for unrelated symptoms; his last documented outpatient encounter was with the health department in 2022 for STI screening.   Physical Exam   Triage Vital Signs: ED Triage Vitals  Enc Vitals Group     BP 01/27/23 1153 (!) 134/92     Pulse Rate 01/27/23 1153 79     Resp 01/27/23 1153 18     Temp 01/27/23 1153 98.4 F (36.9 C)     Temp Source 01/27/23 1153 Oral     SpO2 01/27/23 1153 99 %     Weight 01/27/23 1154 190 lb (86.2 kg)     Height 01/27/23 1154 5\' 9"  (1.753 m)     Head Circumference --      Peak Flow --      Pain Score 01/27/23 1154 10     Pain Loc --      Pain Edu? --      Excl. in GC? --     Most recent vital signs: Vitals:   01/27/23 1153  BP: (!) 134/92  Pulse: 79  Resp: 18  Temp: 98.4 F (36.9 C)  SpO2: 99%     General: Awake, no distress.  CV:  Good peripheral perfusion.  Resp:  Normal effort.  Abd:  No distention. Other:  EOMI.  PERRLA.  Right eye photophobia.  Right eye appears erythematous with clear discharge.  No swelling.  No  facial droop.  No motor or sensory deficits to the face.  Motor and sensory intact in all extremities.   ED Results / Procedures / Treatments   Labs (all labs ordered are listed, but only abnormal results are displayed) Labs Reviewed - No data to display   EKG     RADIOLOGY    PROCEDURES:  Critical Care performed: No  Procedures   MEDICATIONS ORDERED IN ED: Medications  acetaminophen (TYLENOL) 500 MG tablet (has no administration in time range)  fluorescein ophthalmic strip 1 strip (1 strip Right Eye Given 01/27/23 1350)  tetracaine (PONTOCAINE) 0.5 % ophthalmic solution 2 drop (2 drops Right Eye Given by Other 01/27/23 1349)  acetaminophen (TYLENOL) tablet 1,000 mg (1,000 mg Oral Given 01/27/23 1457)     IMPRESSION / MDM / ASSESSMENT AND PLAN / ED COURSE  I reviewed the triage vital signs and the nursing notes.  Differential diagnosis includes, but is not limited to, corneal abrasion, ulcer, traumatic iritis, keratoconjunctivitis.  We will perform fluorescein exam,  check visual acuity, consult ophthalmology.  Patient's presentation is most consistent with acute presentation with potential threat to life or bodily function.  ----------------------------------------- 2:51 PM on 01/27/2023 -----------------------------------------  Fluorescein exam reveals no evidence of corneal abrasion or ulcer.  There is no evidence of foreign body.  The patient's symptoms have improved with tetracaine.  His visual acuity in the affected eye is 20/25 and it is 20/15 in the unaffected eye both uncorrected.  I consulted and discussed case with Dr. Inez Pilgrim from ophthalmology.  He advises that the symptoms are most consistent with traumatic iritis.  He recommends starting the patient on Pred forte and having him follow-up in the ophthalmology office tomorrow.  The patient is in agreement with this plan and feels comfortable going home.  I counseled him on the results of the examination  and the ophthalmology recommendations.  I gave strict return precautions and he expresses understanding.   FINAL CLINICAL IMPRESSION(S) / ED DIAGNOSES   Final diagnoses:  Traumatic iritis     Rx / DC Orders   ED Discharge Orders          Ordered    prednisoLONE acetate (PRED FORTE) 1 % ophthalmic suspension  4 times daily        01/27/23 1450             Note:  This document was prepared using Dragon voice recognition software and may include unintentional dictation errors.    Dionne Bucy, MD 01/27/23 [phone]

## 2023-04-22 ENCOUNTER — Telehealth: Payer: Self-pay

## 2023-04-22 NOTE — Telephone Encounter (Signed)
Medical records request was sent from [facility] TO [facility] 04/22/2023

## 2023-06-06 ENCOUNTER — Emergency Department
Admission: EM | Admit: 2023-06-06 | Discharge: 2023-06-06 | Disposition: A | Discharge status: Home / Self Care | Payer: Self-pay | Attending: Emergency Medicine | Admitting: Emergency Medicine

## 2023-06-06 ENCOUNTER — Encounter: Payer: Self-pay | Admitting: Emergency Medicine

## 2023-06-06 ENCOUNTER — Other Ambulatory Visit: Payer: Self-pay

## 2023-06-06 DIAGNOSIS — K047 Periapical abscess without sinus: Secondary | ICD-10-CM | POA: Insufficient documentation

## 2023-06-06 MED ORDER — TRAMADOL HCL 50 MG PO TABS: 50.0000 mg | ORAL_TABLET | Freq: Four times a day (QID) | ORAL | 0 refills | Status: AC | PRN

## 2023-06-06 MED ORDER — AMOXICILLIN 500 MG PO CAPS: 500.0000 mg | ORAL_CAPSULE | Freq: Three times a day (TID) | ORAL | 0 refills | Status: AC

## 2023-06-06 NOTE — ED Provider Notes (Signed)
   [facility] Provider Note    Event Date/Time   First MD Initiated Contact with Patient 06/06/23 1040     (approximate)   History   Facial Pain   HPI  Carlos Collier is a 37 y.o. male who presents with complaints of left-sided facial pain.  He also reports dental pain and thinks this may be related.  No fevers, no difficulty swallowing     Physical Exam   Triage Vital Signs: ED Triage Vitals  Encounter Vitals Group     BP 06/06/23 1029 (!) 153/102     Systolic BP Percentile --      Diastolic BP Percentile --      Pulse Rate 06/06/23 1029 75     Resp 06/06/23 1029 16     Temp 06/06/23 1029 98.3 F (36.8 C)     Temp Source 06/06/23 1029 Oral     SpO2 06/06/23 1029 99 %     Weight 06/06/23 1030 86.2 kg (190 lb)     Height 06/06/23 1030 1.753 m (5\' 9" )     Head Circumference --      Peak Flow --      Pain Score 06/06/23 1029 6     Pain Loc --      Pain Education --      Exclude from Growth Chart --     Most recent vital signs: Vitals:   06/06/23 1029 06/06/23 1053  BP: (!) 153/102 120/86  Pulse: 75 67  Resp: 16 16  Temp: 98.3 F (36.8 C)   SpO2: 99% 98%     General: Awake, no distress.  CV:  Good peripheral perfusion.  Resp:  Normal effort.  Abd:  No distention.  Other:  Tenderness along the gums upper left molars, no drainable abscess   ED Results / Procedures / Treatments   Labs (all labs ordered are listed, but only abnormal results are displayed) Labs Reviewed - No data to display   EKG     RADIOLOGY     PROCEDURES:  Critical Care performed:   Procedures   MEDICATIONS ORDERED IN ED: Medications - No data to display   IMPRESSION / MDM / ASSESSMENT AND PLAN / ED COURSE  I reviewed the triage vital signs and the nursing notes. Patient's presentation is most consistent with acute, uncomplicated illness.  Patient presents with facial pain as above, exam is most consistent with dental infection, no  drainable abscess.  Will start the patient on antibiotics.  Outpatient follow-up with dentist recommended        FINAL CLINICAL IMPRESSION(S) / ED DIAGNOSES   Final diagnoses:  Dental infection     Rx / DC Orders   ED Discharge Orders          Ordered    amoxicillin (AMOXIL) 500 MG capsule  3 times daily        06/06/23 1045    traMADol (ULTRAM) 50 MG tablet  Every 6 hours PRN        06/06/23 1045             Note:  This document was prepared using Dragon voice recognition software and may include unintentional dictation errors.   Jene Every, MD 06/06/23 1351

## 2023-06-06 NOTE — ED Triage Notes (Signed)
Pt to ED via POV for facial pain. Pt states that this has been going happening off and on for the last month. Pt states that he was given Augmentin and a steroid by the urgent care for the pain and swelling but it has not worked. Left side of pts face is swollen.

## 2023-07-20 ENCOUNTER — Emergency Department
Admission: EM | Admit: 2023-07-20 | Discharge: 2023-07-20 | Disposition: A | Discharge status: Home / Self Care | Payer: Self-pay | Attending: Emergency Medicine | Admitting: Emergency Medicine

## 2023-07-20 ENCOUNTER — Encounter: Payer: Self-pay | Admitting: Intensive Care

## 2023-07-20 ENCOUNTER — Other Ambulatory Visit: Payer: Self-pay

## 2023-07-20 ENCOUNTER — Emergency Department: Payer: Self-pay

## 2023-07-20 DIAGNOSIS — G44209 Tension-type headache, unspecified, not intractable: Secondary | ICD-10-CM

## 2023-07-20 DIAGNOSIS — G43001 Migraine without aura, not intractable, with status migrainosus: Secondary | ICD-10-CM | POA: Insufficient documentation

## 2023-07-20 LAB — BASIC METABOLIC PANEL
Anion gap: 7 (ref 5–15)
BUN: 13 mg/dL (ref 6–20)
CO2: 26 mmol/L (ref 22–32)
Calcium: 9 mg/dL (ref 8.9–10.3)
Chloride: 104 mmol/L (ref 98–111)
Creatinine, Ser: 0.71 mg/dL (ref 0.61–1.24)
GFR, Estimated: >60 mL/min (ref >60)
Glucose, Bld: 97 mg/dL (ref 70–99)
Potassium: 4.2 mmol/L (ref 3.5–5.1)
Sodium: 137 mmol/L (ref 135–145)

## 2023-07-20 LAB — CBC WITH DIFFERENTIAL/PLATELET
Abs Immature Granulocytes: 0.01 10*3/uL (ref 0.00–0.07)
Basophils Absolute: 0 10*3/uL (ref 0.0–0.1)
Basophils Relative: 1 %
Eosinophils Absolute: 0.1 10*3/uL (ref 0.0–0.5)
Eosinophils Relative: 2 %
HCT: 43.9 % (ref 39.0–52.0)
Hemoglobin: 14.9 g/dL (ref 13.0–17.0)
Immature Granulocytes: 0 %
Lymphocytes Relative: 35 %
Lymphs Abs: 2.3 10*3/uL (ref 0.7–4.0)
MCH: 33.5 pg (ref 26.0–34.0)
MCHC: 33.9 g/dL (ref 30.0–36.0)
MCV: 98.7 fL (ref 80.0–100.0)
Monocytes Absolute: 0.3 10*3/uL (ref 0.1–1.0)
Monocytes Relative: 4 %
Neutro Abs: 3.9 10*3/uL (ref 1.7–7.7)
Neutrophils Relative %: 58 %
Platelets: 272 10*3/uL (ref 150–400)
RBC: 4.45 MIL/uL (ref 4.22–5.81)
RDW: 11.9 % (ref 11.5–15.5)
WBC: 6.5 10*3/uL (ref 4.0–10.5)
nRBC: 0 % (ref 0.0–0.2)

## 2023-07-20 LAB — SEDIMENTATION RATE: Sed Rate: 1 mm/h (ref 0–15)

## 2023-07-20 MED ORDER — METHOCARBAMOL 750 MG PO TABS: 750.0000 mg | ORAL_TABLET | Freq: Four times a day (QID) | ORAL | 0 refills | Status: AC

## 2023-07-20 MED ORDER — PREDNISONE 10 MG (21) PO TBPK
ORAL_TABLET | ORAL | 0 refills | Status: AC
Start: 2023-07-20 — End: ?

## 2023-07-20 NOTE — ED Provider Notes (Signed)
[facility] Provider Note    Event Date/Time   First MD Initiated Contact with Patient 07/20/23 1300     (approximate)   History   Headache and Otalgia   HPI  Carlos Collier is a 37 y.o. male presents emergency department complaining of headache along the left side of his head.  Patient states that the pulsing type pain at his temple.  States been ongoing for 2 months on and off.  States he came in was seen in the ED and they gave him antibiotics for sinus infection.  States that did not help at all.  No fever or chills.  States does get blurred vision in the left eye when the pain happens.  Is very sensitive to light.  No vomiting due to headaches.      Physical Exam   Triage Vital Signs: ED Triage Vitals  Encounter Vitals Group     BP 07/20/23 1035 (!) 118/94     Systolic BP Percentile --      Diastolic BP Percentile --      Pulse Rate 07/20/23 1035 76     Resp 07/20/23 1035 16     Temp 07/20/23 1035 98.4 F (36.9 C)     Temp Source 07/20/23 1035 Oral     SpO2 07/20/23 1035 96 %     Weight 07/20/23 1036 191 lb (86.6 kg)     Height 07/20/23 1036 5\' 8"  (1.727 m)     Head Circumference --      Peak Flow --      Pain Score 07/20/23 1036 6     Pain Loc --      Pain Education --      Exclude from Growth Chart --     Most recent vital signs: Vitals:   07/20/23 1035  BP: (!) 118/94  Pulse: 76  Resp: 16  Temp: 98.4 F (36.9 C)  SpO2: 96%     General: Awake, no distress.   CV:  Good peripheral perfusion. regular rate and  rhythm Resp:  Normal effort.  Abd:  No distention.   Other:  Cranial nerves II through XII grossly intact, slightly tender at the left temple upon palpation, no swelling noted in this area, neck is supple, no lymphadenopathy   ED Results / Procedures / Treatments   Labs (all labs ordered are listed, but only abnormal results are displayed) Labs Reviewed  BASIC METABOLIC PANEL  CBC WITH DIFFERENTIAL/PLATELET   SEDIMENTATION RATE     EKG     RADIOLOGY CT of the head    PROCEDURES:   Procedures   MEDICATIONS ORDERED IN ED: Medications - No data to display   IMPRESSION / MDM / ASSESSMENT AND PLAN / ED COURSE  I reviewed the triage vital signs and the nursing notes.                              Differential diagnosis includes, but is not limited to, migraine, tension headache, temporal arteritis, subdural, SAH, tumor, sinusitis  Patient's presentation is most consistent with acute illness / injury with system symptoms.   Patient appears to be well, at this time will get CT of the head to evaluate for subdural, SAH, or tumor  Labs ordered to determine infection/temporal arteritis etc.  Labs reassuring, CT of the head independently reviewed interpreted by me as being negative for any acute abnormality  I did explain the findings  to patient.  Feel he has more tension headaches related to stress, he is also darting to need reading glasses.  He is to follow-up with an ophthalmologist for an eye exam.  Follow-up with neurology for tension headaches.  I did try him on a course of steroids and Robaxin.  Follow-up with his regular doctor.  Recommend he get a mouthguard so he is not gritting his teeth.  Patient is in agreement treatment plan.  Return if worsening.  Discharged stable condition.      FINAL CLINICAL IMPRESSION(S) / ED DIAGNOSES   Final diagnoses:  Tension headache  Migraine without aura and with status migrainosus, not intractable     Rx / DC Orders   ED Discharge Orders          Ordered    predniSONE (STERAPRED UNI-PAK 21 TAB) 10 MG (21) TBPK tablet        07/20/23 1552    methocarbamol (ROBAXIN-750) 750 MG tablet  4 times daily        07/20/23 1552             Note:  This document was prepared using Dragon voice recognition software and may include unintentional dictation errors.    Faythe Ghee, PA-C 07/20/23 1644    Sharyn Creamer,  MD 07/24/23 1537

## 2023-07-20 NOTE — ED Notes (Signed)
D/C, new RX dicussed with pt, pt verbalized understanding. NAD noted. Pt ambulatory with steady gait on D/C.

## 2023-07-20 NOTE — ED Triage Notes (Signed)
Patient c/o head pain for two months. Also c/o red dots on top of mouth and states his left ear hurts.  Seen recently for tooth infection  A&O x4 in triage.  Denies vision changes

## 2023-07-20 NOTE — ED Provider Triage Note (Signed)
Emergency Medicine Provider Triage Evaluation Note  Wray Telander , a 37 y.o. male  was evaluated in triage.  Pt complains of sinus congestion and left sided ear pain. Also reports pain in left eye and face. Has tried multiple medications at home to self treat. Unclear on where the pain originates, has been treated for dental infection.   Review of Systems  Positive: Left sided facial pain, congestion, left ear pain Negative:   Physical Exam  There were no vitals taken for this visit. Gen:   Awake, no distress   Resp:  Normal effort  MSK:   Moves extremities without difficulty  Other:  Bilateral EACs are clear and TMs are translucent, no oral lesions or abscess  Medical Decision Making  Medically screening exam initiated at 10:33 AM.  Appropriate orders placed.  Simmon Rieken was informed that the remainder of the evaluation will be completed by another provider, this initial triage assessment does not replace that evaluation, and the importance of remaining in the ED until their evaluation is complete.     Cameron Ali, PA-C 07/20/23 1040

## 2023-07-20 NOTE — Discharge Instructions (Signed)
Buy a mouth guard Make an appointment to have your eyes checked Try to relieve stress by exercising, meditating, or doing things you enjoy
